# Patient Record
Sex: Female | Born: 1955 | Race: White | Hispanic: No | Marital: Married | Smoking: Former smoker
Health system: Southern US, Community
[De-identification: ages and names within clinical notes are randomized; demographics above are authoritative.]

## PROBLEM LIST (undated history)

## (undated) DIAGNOSIS — E669 Obesity, unspecified: Secondary | ICD-10-CM

## (undated) HISTORY — DX: Obesity, unspecified: E66.9

---

## 2005-05-09 ENCOUNTER — Encounter: Payer: Self-pay | Admitting: Family Medicine

## 2005-05-09 ENCOUNTER — Other Ambulatory Visit: Admission: RE | Admit: 2005-05-09 | Discharge: 2005-05-09 | Payer: Self-pay | Admitting: Family Medicine

## 2005-05-09 ENCOUNTER — Ambulatory Visit: Payer: Self-pay | Admitting: Family Medicine

## 2005-05-29 ENCOUNTER — Ambulatory Visit: Payer: Self-pay

## 2005-05-29 ENCOUNTER — Encounter: Payer: Self-pay | Admitting: Cardiology

## 2005-07-21 ENCOUNTER — Encounter: Admission: RE | Admit: 2005-07-21 | Discharge: 2005-07-21 | Payer: Self-pay | Admitting: Family Medicine

## 2005-08-08 ENCOUNTER — Ambulatory Visit: Payer: Self-pay | Admitting: Family Medicine

## 2005-09-19 ENCOUNTER — Ambulatory Visit: Payer: Self-pay | Admitting: Family Medicine

## 2005-11-03 ENCOUNTER — Ambulatory Visit: Payer: Self-pay | Admitting: Family Medicine

## 2005-12-27 ENCOUNTER — Ambulatory Visit: Payer: Self-pay | Admitting: Family Medicine

## 2008-08-26 ENCOUNTER — Ambulatory Visit: Payer: Self-pay | Admitting: Internal Medicine

## 2009-12-29 ENCOUNTER — Ambulatory Visit: Payer: Self-pay | Admitting: Family Medicine

## 2009-12-29 ENCOUNTER — Encounter: Payer: Self-pay | Admitting: Internal Medicine

## 2009-12-29 ENCOUNTER — Other Ambulatory Visit: Admission: RE | Admit: 2009-12-29 | Discharge: 2009-12-29 | Payer: Self-pay | Admitting: Family Medicine

## 2009-12-29 DIAGNOSIS — Z780 Asymptomatic menopausal state: Secondary | ICD-10-CM | POA: Insufficient documentation

## 2009-12-29 LAB — CONVERTED CEMR LAB
Bilirubin Urine: NEGATIVE
Blood in Urine, dipstick: NEGATIVE
Ketones, urine, test strip: NEGATIVE
Specific Gravity, Urine: 1.015
Urobilinogen, UA: NEGATIVE
pH: 6

## 2009-12-30 ENCOUNTER — Encounter (INDEPENDENT_AMBULATORY_CARE_PROVIDER_SITE_OTHER): Payer: Self-pay | Admitting: *Deleted

## 2009-12-30 LAB — CONVERTED CEMR LAB
ALT: 17 units/L (ref 0–35)
AST: 18 units/L (ref 0–37)
Basophils Relative: 0.7 % (ref 0.0–3.0)
CO2: 28 meq/L (ref 19–32)
Calcium: 9.6 mg/dL (ref 8.4–10.5)
Eosinophils Absolute: 0.4 10*3/uL (ref 0.0–0.7)
Eosinophils Relative: 5.6 % — ABNORMAL HIGH (ref 0.0–5.0)
GFR calc non Af Amer: 95 mL/min (ref >60)
HCT: 42.8 % (ref 36.0–46.0)
Lymphocytes Relative: 30.4 % (ref 12.0–46.0)
Lymphs Abs: 2.4 10*3/uL (ref 0.7–4.0)
MCHC: 34.3 g/dL (ref 30.0–36.0)
Monocytes Absolute: 0.5 10*3/uL (ref 0.1–1.0)
Monocytes Relative: 6 % (ref 3.0–12.0)
Potassium: 5 meq/L (ref 3.5–5.1)
Total Bilirubin: 0.4 mg/dL (ref 0.3–1.2)
Total Protein: 6.9 g/dL (ref 6.0–8.3)
WBC: 7.7 10*3/uL (ref 4.5–10.5)

## 2010-01-03 ENCOUNTER — Encounter (INDEPENDENT_AMBULATORY_CARE_PROVIDER_SITE_OTHER): Payer: Self-pay | Admitting: *Deleted

## 2010-01-03 LAB — CONVERTED CEMR LAB: Pap Smear: NEGATIVE

## 2010-01-14 ENCOUNTER — Ambulatory Visit: Payer: Self-pay | Admitting: Family Medicine

## 2010-01-14 DIAGNOSIS — J019 Acute sinusitis, unspecified: Secondary | ICD-10-CM

## 2010-01-20 ENCOUNTER — Encounter: Admission: RE | Admit: 2010-01-20 | Discharge: 2010-01-20 | Payer: Self-pay | Admitting: Family Medicine

## 2010-02-02 ENCOUNTER — Encounter (INDEPENDENT_AMBULATORY_CARE_PROVIDER_SITE_OTHER): Payer: Self-pay | Admitting: *Deleted

## 2010-02-07 ENCOUNTER — Ambulatory Visit: Payer: Self-pay | Admitting: Gastroenterology

## 2010-02-16 ENCOUNTER — Ambulatory Visit: Payer: Self-pay | Admitting: Gastroenterology

## 2010-06-07 NOTE — Letter (Signed)
Summary: Moviprep Instructions  [facility]  [address]   [place], [place] [address]   Phone: [phone]  Fax: [phone]       Judith [name]    1955-05-29    MRN: [id number]        Procedure Day /Date: Wednesday, 02-16-10     Arrival Time: 9:30 a.m.      Procedure Time: 10:30 a.m.     Location of Procedure:                    x   [facility] (4th Floor)                        PREPARATION FOR COLONOSCOPY WITH MOVIPREP   Starting 5 days prior to your procedure 02-11-10  do not eat nuts, seeds, popcorn, corn, beans, peas,  salads, or any raw vegetables.  Do not take any fiber supplements (e.g. Metamucil, Citrucel, and Benefiber).  THE DAY BEFORE YOUR PROCEDURE         DATE:  02-15-10  DAY: Tuesday  1.  Drink clear liquids the entire day-NO SOLID FOOD  2.  Do not drink anything colored red or purple.  Avoid juices with pulp.  No orange juice.  3.  Drink at least 64 oz. (8 glasses) of fluid/clear liquids during the day to prevent dehydration and help the prep work efficiently.  CLEAR LIQUIDS INCLUDE: Water Jello Ice Popsicles Tea (sugar ok, no milk/cream) Powdered fruit flavored drinks Coffee (sugar ok, no milk/cream) Gatorade Juice: apple, white grape, white cranberry  Lemonade Clear bullion, consomm, broth Carbonated beverages (any kind) Strained chicken noodle soup Hard Candy                             4.  In the morning, mix first dose of MoviPrep solution:    Empty 1 Pouch A and 1 Pouch B into the disposable container    Add lukewarm drinking water to the top line of the container. Mix to dissolve    Refrigerate (mixed solution should be used within 24 hrs)  5.  Begin drinking the prep at 5:00 p.m. The MoviPrep container is divided by 4 marks.   Every 15 minutes drink the solution down to the next mark (approximately 8 oz) until the full liter is complete.   6.  Follow completed prep with 16 oz of clear liquid  of your choice (Nothing red or purple).  Continue to drink clear liquids until bedtime.  7.  Before going to bed, mix second dose of MoviPrep solution:    Empty 1 Pouch A and 1 Pouch B into the disposable container    Add lukewarm drinking water to the top line of the container. Mix to dissolve    Refrigerate  THE DAY OF YOUR PROCEDURE      DATE: 02-16-10  DAY: Wednesday  Beginning at 5:30 a.m. (5 hours before procedure):         1. Every 15 minutes, drink the solution down to the next mark (approx 8 oz) until the full liter is complete.  2. Follow completed prep with 16 oz. of clear liquid of your choice.    3. You may drink clear liquids until 8:30 a.m. (2 HOURS BEFORE PROCEDURE).   MEDICATION INSTRUCTIONS  Unless otherwise instructed, you should take regular prescription medications with a small sip of water   as early as  possible the morning of your procedure.           OTHER INSTRUCTIONS  You will need a responsible adult at least 53 years of age to accompany you and drive you home.   This person must remain in the waiting room during your procedure.  Wear loose fitting clothing that is easily removed.  Leave jewelry and other valuables at home.  However, you may wish to bring a book to read or  an iPod/MP3 player to listen to music as you wait for your procedure to start.  Remove all body piercing jewelry and leave at home.  Total time from sign-in until discharge is approximately 2-3 hours.  You should go home directly after your procedure and rest.  You can resume normal activities the  day after your procedure.  The day of your procedure you should not:   Drive   Make legal decisions   Operate machinery   Drink alcohol   Return to work  You will receive specific instructions about eating, activities and medications before you leave.    The above instructions have been reviewed and explained to me by   Ezra Sites RN  February 07, 2010 8:35  AM     I fully understand and can verbalize these instructions _____________________________ Date _________

## 2010-06-07 NOTE — Letter (Signed)
Summary: Primary Care Consult Scheduled Letter  [facility]  [address] [place], [place] [address]   Phone: [phone]  Fax: [phone]      12/30/2009 MRN: [id number]  [name] [address] [place], [place]  [address]    Dear Ms. Donata Clay,    We have scheduled an appointment for you.  At the recommendation of Dr. Loreen Freud, we have scheduled you for a Screening Colonoscopy with [facility] on 01-13-2010 at 7:45am.  Their address is [address], [address], [place] [address]. The office phone number is [phone].  If this appointment day and time is not convenient for you, please feel free to call the office of the doctor you are being referred to at the number listed above and reschedule the appointment.    It is important for you to keep your scheduled appointments. We are here to make sure you are given good patient care.   Thank you,    Renee, Patient Care Coordinator [facility]

## 2010-06-07 NOTE — Procedures (Signed)
Summary: Colonoscopy  Patient: Judith [name] Note: All result statuses are Final unless otherwise noted.  Tests: (1) Colonoscopy (COL)   COL Colonoscopy           DONE     [facility]     [address]     [place], [place]  [address]           COLONOSCOPY PROCEDURE REPORT           PATIENT:  Judith, [name]  MR#:  [id number]     BIRTHDATE:  02-03-1956, 54 yrs. old  GENDER:  female           ENDOSCOPIST:  Barbette Hair. Arlyce Dice, MD     Referred by:  Loreen Freud, DO           PROCEDURE DATE:  02/16/2010     PROCEDURE:  Diagnostic Colonoscopy     ASA CLASS:  Class I     INDICATIONS:  1) Routine Risk Screening           MEDICATIONS:   Fentanyl 75 mcg IV, Versed 6 mg IV           DESCRIPTION OF PROCEDURE:   After the risks benefits and     alternatives of the procedure were thoroughly explained, informed     consent was obtained.  Digital rectal exam was performed and     revealed no abnormalities.   The LB CF-[id number] endoscope     was introduced through the anus and advanced to the cecum, which     was identified by both the appendix and ileocecal valve, without     limitations.  The quality of the prep was excellent, using     MiraLax.  The instrument was then slowly withdrawn as the colon     was fully examined.     <<PROCEDUREIMAGES>>           FINDINGS:  Mild diverticulosis was found in the sigmoid colon (see     image19).  This was otherwise a normal examination of the colon     (see image2, image3, image5, image6, image8, image9, image11,     image13, image20, and image21).   Retroflexed views in the rectum     revealed no abnormalities.    The time to cecum =  3.50  minutes.     The scope was then withdrawn (time =  6.75  min) from the patient     and the procedure completed.           COMPLICATIONS:  None           ENDOSCOPIC IMPRESSION:     1) Mild diverticulosis in the sigmoid colon     2) Otherwise normal examination  RECOMMENDATIONS:     1) Continue current colorectal screening recommendations for     "routine risk" patients with a repeat colonoscopy in 10 years.           REPEAT EXAM:  In 10 year(s) for Colonoscopy.           ______________________________     Barbette Hair. Arlyce Dice, MD           CC:           n.     eSIGNED:   Barbette Hair. Kaplan at 02/16/2010 11:33 AM           Page 2 of 3   [name], Judith, [name] [id number]  Note: An exclamation mark (!) indicates a  result that was not dispersed into the flowsheet. Document Creation Date: 02/16/2010 11:34 AM _______________________________________________________________________  (1) Order result status: Final Collection or observation date-time: 02/16/2010 11:29 Requested date-time:  Receipt date-time:  Reported date-time:  Referring Physician:   Ordering Physician: Melvia Heaps [phone]) Specimen Source:  Source: Launa Grill Order Number: [id number] Lab site:   Appended Document: Colonoscopy    Clinical Lists Changes  Observations: Added new observation of COLONNXTDUE: 02/2020 (02/16/2010 13:13)

## 2010-06-07 NOTE — Letter (Signed)
Summary: Results Follow up Letter  [facility]  [address] [place], [place] [address]   Phone: [phone]  Fax: [phone]    01/03/2010 MRN: [id number]  [name] [address] [place], [place]  [address]  Dear Ms. Judith [name],  The following are the results of your recent test(s):  Test         Result    Pap Smear:        Normal __X__  Not Normal _____ Comments: ______________________________________________________ Cholesterol: LDL(Bad cholesterol):         Your goal is less than:         HDL (Good cholesterol):       Your goal is more than: Comments:  ______________________________________________________ Mammogram:        Normal _____  Not Normal _____ Comments:  ___________________________________________________________________ Hemoccult:        Normal _____  Not normal _______ Comments:    _____________________________________________________________________ Other Tests:    We routinely do not discuss normal results over the telephone.  If you desire a copy of the results, or you have any questions about this information we can discuss them at your next office visit.   Sincerely,

## 2010-06-07 NOTE — Letter (Signed)
Summary: Previsit letter  [facility]  [address] [place], [place] [address]   Phone: [phone]  Fax: [phone]       12/30/2009 MRN: [id number]  [name] [address] [place], [place]  [address]  Dear Judith [name],  Welcome to the Gastroenterology Division at [facility].    You are scheduled to see a nurse for your pre-procedure visit on 02/07/2010 at 8:30AM on the 3rd floor at [facility], [address]  We ask that you try to arrive at our office 15 minutes prior to your appointment time to allow for check-in.  Your nurse visit will consist of discussing your medical and surgical history, your immediate family medical history, and your medications.    Please bring a complete list of all your medications or, if you prefer, bring the medication bottles and we will list them.  We will need to be aware of both prescribed and over the counter drugs.  We will need to know exact dosage information as well.  If you are on blood thinners (Coumadin, Plavix, Aggrenox, Ticlid, etc.) please call our office today/prior to your appointment, as we need to consult with your physician about holding your medication.   Please be prepared to read and sign documents such as consent forms, a financial agreement, and acknowledgement forms.  If necessary, and with your consent, a friend or relative is welcome to sit-in on the nurse visit with you.  Please bring your insurance card so that we may make a copy of it.  If your insurance requires a referral to see a specialist, please bring your referral form from your primary care physician.  No co-pay is required for this nurse visit.     If you cannot keep your appointment, please call [phone] to cancel or reschedule prior to your appointment date.  This allows [place] the opportunity to schedule an appointment for another patient in need of care.    Thank you for choosing [facility] for your medical needs.  We appreciate the opportunity to care for you.  Please visit [place] at our website  to learn more about our practice.                     Sincerely.                                                                                                                   The Gastroenterology Division

## 2010-06-07 NOTE — Miscellaneous (Signed)
Summary: [facility]  Clinical Lists Changes  Medications: Added new medication of MOVIPREP 100 GM  SOLR (PEG-KCL-NACL-NASULF-NA ASC-C) As per prep instructions. - Signed Rx of MOVIPREP 100 GM  SOLR (PEG-KCL-NACL-NASULF-NA ASC-C) As per prep instructions.;  #1 x 0;  Signed;  Entered by: Ezra Sites RN;  Authorized by: Louis Meckel MD;  Method used: [facility] [phone]*, [address], [place], [place], [place]  [address], Ph: [phone], Fax: [phone] Observations: Added new observation of NKA: T (02/07/2010 8:20)    Prescriptions: MOVIPREP 100 GM  SOLR (PEG-KCL-NACL-NASULF-NA ASC-C) As per prep instructions.  #1 x 0   Entered by:   Ezra Sites RN   Authorized by:   Louis Meckel MD   Signed by:   Ezra Sites RN on 02/07/2010   Method used:   [facility]ronically to        [facility] [phone]* (retail)       [address]       [place], [place]  [address]       Ph: [phone]       Fax: [phone]   RxID:   [id number]

## 2010-06-07 NOTE — Assessment & Plan Note (Signed)
Summary: cold//lch   Vital Signs:  Patient profile:   55 year old female Weight:      230.4 pounds O2 Sat:      98 % on Room air Temp:     98.2 degrees F Pulse rate:   96 / minute Pulse rhythm:   regular BP sitting:   120 / 74  (left arm)  Vitals Entered By: Almeta Monas CMA Duncan Dull) (January 14, 2010 11:35 AM)  O2 Flow:  Room air CC: c/o congestion and cough that has gotten worst X3days, URI symptoms   CC:  c/o congestion and cough that has gotten worst X3days and URI symptoms.  History of Present Illness:       This is a 55 year old woman who presents with URI symptoms.  The symptoms began 53 days ago.  Pt is taking mucinex DM with little relief.   .  The patient complains of nasal congestion, purulent nasal discharge, productive cough, and sick contacts.  Associated symptoms include fever of 100.5-103 degrees.  The patient denies fever, low-grade fever (<100.5 degrees), fever of 103.1-104 degrees, fever to >104 degrees, stiff neck, dyspnea, wheezing, rash, vomiting, diarrhea, use of an antipyretic, and response to antipyretic.  The patient also reports sneezing and headache.  The patient denies the following risk factors for Strep sinusitis: unilateral facial pain, unilateral nasal discharge, poor response to decongestant, double sickening, tooth pain, Strep exposure, tender adenopathy, and absence of cough.    Current Medications (verified): 1)  Vit C 2)  Calcium 600 600 Mg Tabs (Calcium Carbonate) .Marland Kitchen.. 1 By Mouth Qd 3)  Vitamin D 1000 Unit Tabs (Cholecalciferol) .Marland Kitchen.. 1 By Mouth Qd 4)  Augmentin 875-125 Mg Tabs (Amoxicillin-Pot Clavulanate) .Marland Kitchen.. 1 By Mouth Two Times A Day 5)  Veramyst 27.5 Mcg/spray Susp (Fluticasone Furoate) .... 2 Sprays Each Nostril Once Daily 6)  Claritin 10 Mg Tabs (Loratadine) .Marland Kitchen.. 1 By Mouth Once Daily  Allergies (verified): No Known Drug Allergies  Past History:  Past medical, surgical, family and social histories (including risk factors) reviewed  for relevance to current acute and chronic problems.  Past Medical History: Reviewed history from 12/29/2009 and no changes required. Current Problems:  POSTMENOPAUSAL STATUS (ICD-V49.81) PREVENTIVE HEALTH CARE (ICD-V70.0)  Past Surgical History: Reviewed history from 12/29/2009 and no changes required. Denies surgical history  Family History: Reviewed history from 12/29/2009 and no changes required. MGF--stroke 60s MGM--CAD 1995---53yo PGF--copd--smoker PGM==died in late 61s  Mother --lymphoma-- 75yo Father--septicemia,  Renal CA no brothers or sisters  Social History: Reviewed history from 12/29/2009 and no changes required. 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  Otoscopic examination reveals clear canals, tympanic membranes are intact bilaterally without bulging, retraction, inflammation or discharge. Hearing is grossly normal bilaterally. Nose:  L frontal sinus tenderness, L maxillary sinus tenderness, R frontal sinus tenderness, and R maxillary sinus tenderness.   Mouth:  Oral mucosa and oropharynx without lesions or exudates.  Teeth in good repair. Neck:  No deformities, masses, or tenderness noted. Lungs:  Normal respiratory effort, chest expands symmetrically. Lungs are clear to auscultation, no crackles or wheezes. Heart:  Normal rate and regular rhythm. S1 and S2 normal without gallop, murmur, click, rub or other extra sounds. Extremities:  No  clubbing, cyanosis, edema, or deformity noted with normal full range of motion of all joints.   Psych:  Cognition and judgment appear intact. Alert and cooperative with normal attention span and concentration. No  apparent delusions, illusions, hallucinations   Impression & Recommendations:  Problem # 1:  SINUSITIS - ACUTE-NOS (ICD-461.9)  Her updated medication list for this problem includes:    Augmentin 875-125 Mg Tabs (Amoxicillin-pot clavulanate) .Marland Kitchen... 1 by mouth two times a day    Veramyst 27.5 Mcg/spray Susp (Fluticasone furoate) .Marland Kitchen... 2 sprays each nostril once daily  Instructed on treatment. Call if symptoms persist or worsen.   Complete Medication List: 1)  Vit C  2)  Calcium 600 600 Mg Tabs (Calcium carbonate) .Marland Kitchen.. 1 by mouth qd 3)  Vitamin D 1000 Unit Tabs (Cholecalciferol) .Marland Kitchen.. 1 by mouth qd 4)  Augmentin 875-125 Mg Tabs (Amoxicillin-pot clavulanate) .Marland Kitchen.. 1 by mouth two times a day 5)  Veramyst 27.5 Mcg/spray Susp (Fluticasone furoate) .... 2 sprays each nostril once daily 6)  Claritin 10 Mg Tabs (Loratadine) .Marland Kitchen.. 1 by mouth once daily Prescriptions: AUGMENTIN 875-125 MG TABS (AMOXICILLIN-POT CLAVULANATE) 1 by mouth two times a day  #20 x 0   Entered and Authorized by:   Loreen Freud DO   Signed by:   Loreen Freud DO on 01/14/2010   Method used:   [facility]        [facility] [phone]* (retail)       [address]       [place], [place]  [address]       Ph: [phone]       Fax: [phone]   RxID:   [id number]

## 2010-06-07 NOTE — Assessment & Plan Note (Signed)
Summary: cpx- lab/cbs   Vital Signs:  Patient profile:   55 year old female Height:      65 inches Weight:      232 pounds BMI:     38.75 Pulse rate:   84 / minute BP sitting:   124 / 84  (right arm)  Vitals Entered By: Almeta Monas CMA Duncan Dull) (December 29, 2009 9:24 AM) CC: cpx, pt is fasting, mammogram and colonoscopy due, pap today wants to discuss menopause   History of Present Illness: Pt here for cpe, pap and labs.  Pt c/o hot flashes since stopping periods in January.  Pt states they are not terrible.  They are bearable.  Preventive Screening-Counseling & Management  Alcohol-Tobacco     Alcohol drinks/day: 3     Alcohol type: wine     Smoking Status: quit     Packs/Day: <0.25     Year Started: 1983     Year Quit: 1985  Caffeine-Diet-Exercise     Caffeine use/day: 0     Does Patient Exercise: no     Exercise Counseling: to improve exercise regimen  Hep-HIV-STD-Contraception     Dental Visit-last 6 months no     Dental Care Counseling: to seek dental care; no dental care within six months     SBE monthly: yes     SBE Education/Counseling: not indicated; SBE done regularly      Sexual History:  currently monogamous and married.        Drug Use:  no.    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 MGF--stroke 60s MGM--CAD 1995---53yo PGF--copd--smoker PGM==died in late 64s  Mother --lymphoma-- 75yo Father--septicemia,  Renal CA no brothers or sisters  Social History: Reviewed history and no changes required. Occupation:  Administrator, arts Married Former Smoker Alcohol use-yes Drug use-no Regular exercise-no Does Patient Exercise:  no Caffeine use/day:  0 Smoking Status:  quit Packs/Day:  <0.25 Dental Care w/in 6 mos.:  no Sexual History:  currently monogamous, married Occupation:  employed Drug Use:  no  Review of Systems      See HPI General:  Denies chills, fatigue, fever, loss of appetite, malaise, sleep disorder, sweats, weakness, and weight loss. Eyes:  Denies blurring, discharge, double vision, eye irritation, eye pain, halos, itching, light sensitivity, red eye, vision loss-1 eye, and vision loss-both eyes; optho--q2y. ENT:  Denies decreased hearing, difficulty swallowing, ear discharge, earache, hoarseness, nasal congestion, nosebleeds, postnasal drainage, ringing in ears, sinus pressure, and sore throat. CV:  Denies bluish discoloration of lips or nails, chest pain or discomfort, difficulty breathing at night, difficulty breathing while lying down, fainting, fatigue, leg cramps with exertion, lightheadness, near fainting, palpitations, shortness of breath with exertion, swelling of feet, swelling of hands, and weight gain. Resp:  Denies chest discomfort, chest pain with inspiration, cough, coughing up blood, excessive  snoring, hypersomnolence, morning headaches, pleuritic, shortness of breath, sputum productive, and wheezing. GI:  Denies abdominal pain, bloody stools, change in bowel habits, constipation, dark tarry stools, diarrhea, excessive appetite, gas, hemorrhoids, indigestion, loss of  appetite, and nausea. GU:  Denies abnormal vaginal bleeding, decreased libido, discharge, dysuria, genital sores, hematuria, incontinence, nocturia, urinary frequency, and urinary hesitancy. MS:  Denies joint pain, joint redness, joint swelling, loss of strength, low back pain, mid back pain, muscle aches, muscle , cramps, muscle weakness, stiffness, and thoracic pain. Derm:  Denies changes in color of skin, changes in nail beds, dryness, excessive perspiration, flushing, hair loss, insect bite(s), itching, lesion(s), poor wound healing, and rash. Neuro:  Denies brief paralysis, difficulty with concentration, disturbances in coordination, falling down, headaches, inability to speak, memory loss, numbness, poor balance, seizures, sensation of room spinning, tingling, tremors, visual disturbances, and weakness. Psych:  Denies alternate hallucination ( auditory/visual), anxiety, depression, easily angered, easily tearful, irritability, mental problems, panic attacks, sense of great danger, suicidal thoughts/plans, thoughts of violence, unusual visions or sounds, and thoughts /plans of harming others. Endo:  Denies cold intolerance, excessive hunger, excessive thirst, excessive urination, heat intolerance, polyuria, and weight change. Heme:  Denies abnormal bruising, bleeding, enlarge lymph nodes, fevers, pallor, and skin discoloration. Allergy:  Denies hives or rash, itching eyes, persistent infections, seasonal allergies, and sneezing.  Physical Exam  General:  Well-developed,well-nourished,in no acute distress; alert,appropriate and cooperative throughout examination Head:  Normocephalic and atraumatic without obvious abnormalities. No apparent alopecia or balding. Eyes:  vision grossly intact, pupils equal, pupils round, pupils reactive to light, and no injection.   Ears:  External ear exam shows no significant lesions or deformities.  Otoscopic examination reveals clear canals, tympanic membranes are  intact bilaterally without bulging, retraction, inflammation or discharge. Hearing is grossly normal bilaterally. Nose:  External nasal examination shows no deformity or inflammation. Nasal mucosa are pink and moist without lesions or exudates. Mouth:  Oral mucosa and oropharynx without lesions or exudates.  Teeth in good repair. Neck:  No deformities, masses, or tenderness noted. Chest Wall:  No deformities, masses, or tenderness noted. Breasts:  No mass, nodules, thickening, tenderness, bulging, retraction, inflamation, nipple discharge or skin changes noted.   Lungs:  Normal respiratory effort, chest expands symmetrically. Lungs are clear to auscultation, no crackles or wheezes. Heart:  normal rate and no murmur.   Abdomen:  Bowel sounds positive,abdomen soft and non-tender without masses, organomegaly or hernias noted. Rectal:  No external abnormalities noted. Normal sphincter tone. No rectal masses or tenderness. heme negative brown stool Genitalia:  Pelvic Exam:        External: normal female genitalia without lesions or masses        Vagina: normal without lesions or masses        Cervix: normal without lesions or masses        Adnexa: normal bimanual exam without masses or fullness        Uterus: normal by palpation        Pap smear: performed Msk:  normal ROM, no joint tenderness, no joint swelling, no joint warmth, no redness over joints, no joint deformities, no joint instability, and no crepitation.   Pulses:  R posterior tibial normal, R dorsalis pedis normal, R carotid normal, L posterior tibial normal, L dorsalis pedis normal, and L carotid normal.   Extremities:  No clubbing, cyanosis, edema, or deformity noted with normal full range of motion of all joints.   Neurologic:  No cranial nerve deficits  noted. Station and gait are normal. Plantar reflexes are down-going bilaterally. DTRs are symmetrical throughout. Sensory, motor and coordinative functions appear intact. Skin:  Intact  without suspicious lesions or rashes Cervical Nodes:  No lymphadenopathy noted Axillary Nodes:  No palpable lymphadenopathy Psych:  Cognition and judgment appear intact. Alert and cooperative with normal attention span and concentration. No apparent delusions, illusions, hallucinations   Impression & Recommendations:  Problem # 1:  PREVENTIVE HEALTH CARE (ICD-V70.0) ghm utd  Orders: Venipuncture (95621) TLB-Lipid Panel (80061-LIPID) TLB-BMP (Basic Metabolic Panel-BMET) (80048-METABOL) TLB-CBC Platelet - w/Differential (85025-CBCD) TLB-Hepatic/Liver Function Pnl (80076-HEPATIC) TLB-TSH (Thyroid Stimulating Hormone) (84443-TSH) TLB-FSH (Follicle Stimulating Hormone) (83001-FSH) TLB-Luteinizing Hormone (LH) (83002-LH) T- * Misc. Laboratory test [phone]) EKG w/ Interpretation (93000) Specimen Handling (78469) UA Dipstick w/o Micro (manual) (81002) Gastroenterology Referral (GI) Radiology Referral (Radiology)  Problem # 2:  POSTMENOPAUSAL STATUS (ICD-V49.81)  Orders: Venipuncture (62952) TLB-Lipid Panel (80061-LIPID) TLB-BMP (Basic Metabolic Panel-BMET) (80048-METABOL) TLB-CBC Platelet - w/Differential (85025-CBCD) TLB-Hepatic/Liver Function Pnl (80076-HEPATIC) TLB-TSH (Thyroid Stimulating Hormone) (84443-TSH) TLB-FSH (Follicle Stimulating Hormone) (83001-FSH) TLB-Luteinizing Hormone (LH) (83002-LH) T- * Misc. Laboratory test [phone]) EKG w/ Interpretation (93000) Specimen Handling (44010) UA Dipstick w/o Micro (manual) (81002)  Complete Medication List: 1)  Vit C   Patient Instructions: 1)  It is important that you exercise reguarly at least 20 minutes 5 times a week. If you develop chest pain, have severe difficulty breathing, or feel very tired, stop exercising immediately and seek medical attention.  2)  Take calcium +vitamin D daily. --- 1200-1500 mg divided daily with 1000u vita D3    EKG  Procedure date:  12/29/2009  Findings:      sinus rhythm 84  bpm  EKG  Procedure date:  12/29/2009  Findings:      Left axis deviation.     Flu Vaccine Next Due:  Refused TD Result Date:  12/21/2004 TD Result:  given TD Next Due:  10 yr  Laboratory Results   Urine Tests   Date/Time Reported: December 29, 2009 10:56 AM   Routine Urinalysis   Color: yellow Appearance: Clear Glucose: negative   (Normal Range: Negative) Bilirubin: negative   (Normal Range: Negative) Ketone: negative   (Normal Range: Negative) Spec. Gravity: 1.015   (Normal Range: 1.003-1.035) Blood: negative   (Normal Range: Negative) pH: 6.0   (Normal Range: 5.0-8.0) Protein: negative   (Normal Range: Negative) Urobilinogen: negative   (Normal Range: 0-1) Nitrite: negative   (Normal Range: Negative) Leukocyte Esterace: negative   (Normal Range: Negative)    Comments: Floydene Flock  December 29, 2009 10:57 AM

## 2011-09-09 ENCOUNTER — Ambulatory Visit (INDEPENDENT_AMBULATORY_CARE_PROVIDER_SITE_OTHER): Payer: PRIVATE HEALTH INSURANCE | Admitting: Family Medicine

## 2011-09-09 VITALS — BP 134/100 | HR 116 | Temp 97.8°F | Resp 16 | Ht 65.0 in | Wt 230.0 lb

## 2011-09-09 DIAGNOSIS — Z23 Encounter for immunization: Secondary | ICD-10-CM

## 2011-09-09 DIAGNOSIS — M25569 Pain in unspecified knee: Secondary | ICD-10-CM

## 2011-09-09 DIAGNOSIS — S81019A Laceration without foreign body, unspecified knee, initial encounter: Secondary | ICD-10-CM

## 2011-09-09 DIAGNOSIS — S81009A Unspecified open wound, unspecified knee, initial encounter: Secondary | ICD-10-CM

## 2011-09-09 NOTE — Progress Notes (Signed)
  Subjective:    Patient ID: Judith [name], female    DOB: 24-May-1955, 56 y.o.   MRN: [id number]  HPI Tripped on stairs at church about 4:15 this afternoon.  Hit Left knee on the stairs.  Went home and saw laceration and here for eval.  Unknown last tetanus.  FROM and strength of knee. Able to walk but hurts.    Review of Systems Negative except as per HPI     Objective:   Physical Exam  Constitutional: She appears well-developed.  Pulmonary/Chest: Effort normal.  Neurological: She is alert.  Skin:          approx 10 cm linear lac, with subcu fat visible, over patellar tendon of left knee.  Full strength and rom.  Tendon intact.           Assessment & Plan:  Knee lac - repaired per Porfirio Oar, PA-C note.  Tetanus shot updated.

## 2011-09-09 NOTE — Patient Instructions (Signed)
Try to avoid deep knee bending until the stitches are removed.  WOUND CARE Please return in 7-10 days to have your stitches/staples removed or sooner if you have concerns. Marland Kitchen Keep area clean and dry for 24 hours. Do not remove bandage, if applied. . After 24 hours, remove bandage and wash wound gently with mild soap and warm water. Reapply a new bandage after cleaning wound, if directed. . Continue daily cleansing with soap and water until stitches/staples are removed. . Do not apply any ointments or creams to the wound while stitches/staples are in place, as this may cause delayed healing. . Notify the office if you experience any of the following signs of infection: Swelling, redness, pus drainage, streaking, fever >101.0 F . Notify the office if you experience excessive bleeding that does not stop after 15-20 minutes of constant, firm pressure.

## 2011-09-09 NOTE — Progress Notes (Signed)
Verbal consent obtained from the patient.  Local anesthesia with 10cc Lidocaine 1% with epinephrine.  Wound scrubbed with soap and water and rinsed.  Wound closed with 13 4-0 Ethilon (#8 HM, #5 SI) sutures.  Wound cleansed and dressed.

## 2011-09-19 ENCOUNTER — Ambulatory Visit (INDEPENDENT_AMBULATORY_CARE_PROVIDER_SITE_OTHER): Payer: PRIVATE HEALTH INSURANCE | Admitting: Family Medicine

## 2011-09-19 VITALS — BP 118/89 | HR 88 | Temp 98.2°F | Resp 18 | Ht 65.0 in | Wt 232.4 lb

## 2011-09-19 DIAGNOSIS — S81019A Laceration without foreign body, unspecified knee, initial encounter: Secondary | ICD-10-CM

## 2011-09-19 DIAGNOSIS — T148XXA Other injury of unspecified body region, initial encounter: Secondary | ICD-10-CM

## 2011-09-19 DIAGNOSIS — L089 Local infection of the skin and subcutaneous tissue, unspecified: Secondary | ICD-10-CM

## 2011-09-19 DIAGNOSIS — S81809A Unspecified open wound, unspecified lower leg, initial encounter: Secondary | ICD-10-CM

## 2011-09-19 MED ORDER — DOXYCYCLINE HYCLATE 100 MG PO TABS: 100.0000 mg | ORAL_TABLET | Freq: Two times a day (BID) | ORAL | Status: AC

## 2011-09-19 NOTE — Progress Notes (Signed)
  Subjective:    Patient ID: Coralyn Pear, female    DOB: [birth year], 56 y.o.   MRN: [id number]  HPI 56 yo female seen 10 days ago for large knee laceration.  Repaired with 13 sutures.  Here for removal.  Medial side more painful than lateral - red.    Review of Systems Negative except as per HPI     Objective:   Physical Exam  Constitutional: She appears well-developed.  Pulmonary/Chest: Effort normal.  Neurological: She is alert.  Skin:       10 cm laceration to left knee, with good closure.  #13 sutures removed without difficulty.  Medial half with erythema, swelling, and tenderness primarily inferiorly.           Assessment & Plan:  Knee laceration - sutures removed Wound infection - doxy.  Recheck in 72 hours.  Sooner if worsens.

## 2011-09-22 ENCOUNTER — Ambulatory Visit (INDEPENDENT_AMBULATORY_CARE_PROVIDER_SITE_OTHER): Payer: PRIVATE HEALTH INSURANCE | Admitting: Physician Assistant

## 2011-09-22 VITALS — BP 131/87 | HR 91 | Temp 98.3°F | Resp 18 | Ht 65.0 in | Wt 232.0 lb

## 2011-09-22 DIAGNOSIS — L03119 Cellulitis of unspecified part of limb: Secondary | ICD-10-CM

## 2011-09-22 DIAGNOSIS — L089 Local infection of the skin and subcutaneous tissue, unspecified: Secondary | ICD-10-CM

## 2011-09-22 MED ORDER — SULFAMETHOXAZOLE-TRIMETHOPRIM 800-160 MG PO TABS: 1.0000 | ORAL_TABLET | Freq: Two times a day (BID) | ORAL | Status: AC

## 2011-09-22 NOTE — Progress Notes (Signed)
  Subjective:    Patient ID: Judith [name], female    DOB: [birth year], 56 y.o.   MRN: [id number]  HPI Patient presents for recheck of cellulitis of the right knee. Initial injury was 09/09/11 and then had sutures removed 09/19/11 and was noted to have surrounding erythema. Patient was placed on doxycycline 100 mg bid which she has been taking but she does not think it is helping. States the wound is still tender to touch and she has not noticed much change in the erythema. Denies any purulent drainage, fever, or chills.     Review of Systems  Constitutional: Negative for fever and chills.  Skin: Positive for wound (of right knee with surrounding erythema on the medial side ).       Objective:   Physical Exam  Constitutional: She is oriented to person, place, and time. She appears well-developed.  HENT:  Head: Normocephalic and atraumatic.  Right Ear: External ear normal.  Left Ear: External ear normal.  Eyes: Conjunctivae are normal.  Neck: Normal range of motion.  Musculoskeletal: Normal range of motion.  Neurological: She is alert and oriented to person, place, and time.  Skin: Erythema: surrounding wound on right knee. no purulence or warmth noted. no induration.          Assessment & Plan:   1. Cellulitis of knee  Will add Bactrim DS bid x 10 days  Recheck 09/24/11 or 09/25/11 for follow up evaluation Recommend rest and elevation over the next 2 days sulfamethoxazole-trimethoprim (BACTRIM DS,SEPTRA DS) 800-160 MG per tablet  2. Wound infection

## 2011-09-23 NOTE — Progress Notes (Signed)
Precepted with Ms. Marte, PA-C, and examined the patient, and agree.

## 2011-09-24 ENCOUNTER — Ambulatory Visit (INDEPENDENT_AMBULATORY_CARE_PROVIDER_SITE_OTHER): Payer: PRIVATE HEALTH INSURANCE | Admitting: Physician Assistant

## 2011-09-24 VITALS — BP 115/79 | HR 88 | Temp 99.2°F | Resp 16 | Ht 64.5 in | Wt 232.0 lb

## 2011-09-24 DIAGNOSIS — L02419 Cutaneous abscess of limb, unspecified: Secondary | ICD-10-CM

## 2011-09-24 DIAGNOSIS — L089 Local infection of the skin and subcutaneous tissue, unspecified: Secondary | ICD-10-CM

## 2011-09-24 DIAGNOSIS — L03119 Cellulitis of unspecified part of limb: Secondary | ICD-10-CM

## 2011-09-24 NOTE — Progress Notes (Signed)
Subjective: Patient presents for recheck of the wound on the right anterior knee. At previous visit on 09/22/11 there was erythema and tenderness around medial aspect of the wound. States there is somewhat decreased sensation around the wound but that the tenderness has improved.  Objective Skin: Today the erythema is markedly decreased and she has less tenderness to palpation of the area.   Assessment/Plan 1. Cellulitis, leg     Continue Bactrim DS and Doxycycline until completed     Follow up at completion of antibiotics, sooner if symptoms worsen

## 2011-10-02 NOTE — Progress Notes (Signed)
Seen and Precepted with Ms. Marte, PA-C and agree.  

## 2012-06-04 ENCOUNTER — Ambulatory Visit (INDEPENDENT_AMBULATORY_CARE_PROVIDER_SITE_OTHER): Payer: BC Managed Care – PPO | Admitting: Family Medicine

## 2012-06-04 ENCOUNTER — Encounter: Payer: Self-pay | Admitting: Family Medicine

## 2012-06-04 VITALS — BP 116/74 | HR 78 | Temp 98.5°F | Wt 235.4 lb

## 2012-06-04 DIAGNOSIS — J40 Bronchitis, not specified as acute or chronic: Secondary | ICD-10-CM

## 2012-06-04 MED ORDER — AZITHROMYCIN 250 MG PO TABS: ORAL_TABLET | ORAL | Status: DC

## 2012-06-04 NOTE — Progress Notes (Signed)
  Subjective:     Judith [name] is a 57 y.o. female who presents for evaluation of symptoms of a URI. Symptoms include congestion, fever laryngitis, non productive cough, shortness of breath and laryngitis. Onset of symptoms was 7 days ago, and has been gradually worsening since that time. Treatment to date: cough suppressants.  The following portions of the patient's history were reviewed and updated as appropriate: allergies, current medications, past family history, past medical history, past social history, past surgical history and problem list.  Review of Systems Pertinent items are noted in HPI.   Objective:    BP 116/74  Pulse 78  Temp 98.5 F (36.9 C) (Oral)  Wt 235 lb 6.4 oz (106.777 kg)  SpO2 97% General appearance: alert, cooperative, appears stated age and no distress Ears: normal TM's and external ear canals both ears Nose: Nares normal. Septum midline. Mucosa normal. No drainage or sinus tenderness. Throat: lips, mucosa, and tongue normal; teeth and gums normal Neck: mild anterior cervical adenopathy, supple, symmetrical, trachea midline and thyroid not enlarged, symmetric, no tenderness/mass/nodules Lungs: diminished breath sounds bilaterally Heart: S1, S2 normal   Assessment:    bronchitis  Plan:    Suggested symptomatic OTC remedies. Nasal saline spray for congestion. Zithromax per orders. Follow up as needed.  Subjective:

## 2012-06-04 NOTE — Patient Instructions (Addendum)

## 2013-02-26 ENCOUNTER — Telehealth: Payer: Self-pay

## 2013-02-26 NOTE — Telephone Encounter (Addendum)
Medication and allergies: updated and reviewed  90 day supply/mail order: na Local pharmacy: [facility]   Immunizations due:  Admin flu vaccine upon arrival  A/P:   No FH, SH changes PAP today Due for MMG Left knee injury requiring 13 sutures recently  To Discuss with Provider: Nothing at this time

## 2013-03-03 ENCOUNTER — Other Ambulatory Visit (HOSPITAL_COMMUNITY)
Admission: RE | Admit: 2013-03-03 | Discharge: 2013-03-03 | Disposition: A | Discharge status: Home / Self Care | Payer: No Typology Code available for payment source | Source: Ambulatory Visit | Attending: Family Medicine | Admitting: Family Medicine

## 2013-03-03 ENCOUNTER — Ambulatory Visit (INDEPENDENT_AMBULATORY_CARE_PROVIDER_SITE_OTHER): Payer: No Typology Code available for payment source | Admitting: Family Medicine

## 2013-03-03 ENCOUNTER — Encounter: Payer: Self-pay | Admitting: Family Medicine

## 2013-03-03 VITALS — BP 120/76 | HR 99 | Temp 98.8°F | Ht 64.5 in | Wt 236.8 lb

## 2013-03-03 DIAGNOSIS — Z0000 Encounter for general adult medical examination without abnormal findings: Secondary | ICD-10-CM

## 2013-03-03 DIAGNOSIS — Z1231 Encounter for screening mammogram for malignant neoplasm of breast: Secondary | ICD-10-CM

## 2013-03-03 DIAGNOSIS — Z01419 Encounter for gynecological examination (general) (routine) without abnormal findings: Secondary | ICD-10-CM | POA: Insufficient documentation

## 2013-03-03 DIAGNOSIS — R319 Hematuria, unspecified: Secondary | ICD-10-CM

## 2013-03-03 DIAGNOSIS — Z124 Encounter for screening for malignant neoplasm of cervix: Secondary | ICD-10-CM

## 2013-03-03 DIAGNOSIS — Z1151 Encounter for screening for human papillomavirus (HPV): Secondary | ICD-10-CM | POA: Insufficient documentation

## 2013-03-03 LAB — CBC WITH DIFFERENTIAL/PLATELET
Basophils Absolute: 0 10*3/uL (ref 0.0–0.1)
Basophils Relative: 0.4 % (ref 0.0–3.0)
Lymphocytes Relative: 18.9 % (ref 12.0–46.0)
MCV: 91.1 fl (ref 78.0–100.0)
Monocytes Absolute: 0.7 10*3/uL (ref 0.1–1.0)
Monocytes Relative: 5.5 % (ref 3.0–12.0)
Neutro Abs: 9.3 10*3/uL — ABNORMAL HIGH (ref 1.4–7.7)
Platelets: 373 10*3/uL (ref 150.0–400.0)
RBC: 4.79 Mil/uL (ref 3.87–5.11)
RDW: 13.4 % (ref 11.5–14.6)
WBC: 13 10*3/uL — ABNORMAL HIGH (ref 4.5–10.5)

## 2013-03-03 LAB — LIPID PANEL
LDL Cholesterol: 115 mg/dL — ABNORMAL HIGH (ref 0–99)
Total CHOL/HDL Ratio: 3
Triglycerides: 96 mg/dL (ref 0.0–149.0)
VLDL: 19.2 mg/dL (ref 0.0–40.0)

## 2013-03-03 LAB — BASIC METABOLIC PANEL
BUN: 15 mg/dL (ref 6–23)
CO2: 28 mEq/L (ref 19–32)
Calcium: 9.4 mg/dL (ref 8.4–10.5)
Chloride: 103 mEq/L (ref 96–112)
Glucose, Bld: 101 mg/dL — ABNORMAL HIGH (ref 70–99)
Sodium: 141 mEq/L (ref 135–145)

## 2013-03-03 LAB — POCT URINALYSIS DIPSTICK
Bilirubin, UA: NEGATIVE
Glucose, UA: NEGATIVE
Ketones, UA: NEGATIVE
Spec Grav, UA: >=1.03

## 2013-03-03 NOTE — Addendum Note (Signed)
Addended by: Verdie Shire on: 03/03/2013 04:59 PM   Modules accepted: Orders

## 2013-03-03 NOTE — Patient Instructions (Addendum)
rto 1 year for cpe    Preventive Care for Adults, Female A healthy lifestyle and preventive care can promote health and wellness. Preventive health guidelines for women include the following key practices.  A routine yearly physical is a good way to check with your caregiver about your health and preventive screening. It is a chance to share any concerns and updates on your health, and to receive a thorough exam.  Visit your dentist for a routine exam and preventive care every 6 months. Brush your teeth twice a day and floss once a day. Good oral hygiene prevents tooth decay and gum disease.  The frequency of eye exams is based on your age, health, family medical history, use of contact lenses, and other factors. Follow your caregiver's recommendations for frequency of eye exams.  Eat a healthy diet. Foods like vegetables, fruits, whole grains, low-fat dairy products, and lean protein foods contain the nutrients you need without too many calories. Decrease your intake of foods high in solid fats, added sugars, and salt. Eat the right amount of calories for you.Get information about a proper diet from your caregiver, if necessary.  Regular physical exercise is one of the most important things you can do for your health. Most adults should get at least 150 minutes of moderate-intensity exercise (any activity that increases your heart rate and causes you to sweat) each week. In addition, most adults need muscle-strengthening exercises on 2 or more days a week.  Maintain a healthy weight. The body mass index (BMI) is a screening tool to identify possible weight problems. It provides an estimate of body fat based on height and weight. Your caregiver can help determine your BMI, and can help you achieve or maintain a healthy weight.For adults 20 years and older:  A BMI below 18.5 is considered underweight.  A BMI of 18.5 to 24.9 is normal.  A BMI of 25 to 29.9 is considered overweight.  A BMI  of 30 and above is considered obese.  Maintain normal blood lipids and cholesterol levels by exercising and minimizing your intake of saturated fat. Eat a balanced diet with plenty of fruit and vegetables. Blood tests for lipids and cholesterol should begin at age 25 and be repeated every 5 years. If your lipid or cholesterol levels are high, you are over 50, or you are at high risk for heart disease, you may need your cholesterol levels checked more frequently.Ongoing high lipid and cholesterol levels should be treated with medicines if diet and exercise are not effective.  If you smoke, find out from your caregiver how to quit. If you do not use tobacco, do not start.  If you are pregnant, do not drink alcohol. If you are breastfeeding, be very cautious about drinking alcohol. If you are not pregnant and choose to drink alcohol, do not exceed 1 drink per day. One drink is considered to be 12 ounces (355 mL) of beer, 5 ounces (148 mL) of wine, or 1.5 ounces (44 mL) of liquor.  Avoid use of street drugs. Do not share needles with anyone. Ask for help if you need support or instructions about stopping the use of drugs.  High blood pressure causes heart disease and increases the risk of stroke. Your blood pressure should be checked at least every 1 to 2 years. Ongoing high blood pressure should be treated with medicines if weight loss and exercise are not effective.  If you are 77 to 55 years old, ask your caregiver if you  should take aspirin to prevent strokes.  Diabetes screening involves taking a blood sample to check your fasting blood sugar level. This should be done once every 3 years, after age 42, if you are within normal weight and without risk factors for diabetes. Testing should be considered at a younger age or be carried out more frequently if you are overweight and have at least 1 risk factor for diabetes.  Breast cancer screening is essential preventive care for women. You should  practice "breast self-awareness." This means understanding the normal appearance and feel of your breasts and may include breast self-examination. Any changes detected, no matter how small, should be reported to a caregiver. Women in their 77s and 30s should have a clinical breast exam (CBE) by a caregiver as part of a regular health exam every 1 to 3 years. After age 86, women should have a CBE every year. Starting at age 80, women should consider having a mammography (breast X-ray test) every year. Women who have a family history of breast cancer should talk to their caregiver about genetic screening. Women at a high risk of breast cancer should talk to their caregivers about having magnetic resonance imaging (MRI) and a mammography every year.  The Pap test is a screening test for cervical cancer. A Pap test can show cell changes on the cervix that might become cervical cancer if left untreated. A Pap test is a procedure in which cells are obtained and examined from the lower end of the uterus (cervix).  Women should have a Pap test starting at age 68.  Between ages 53 and 60, Pap tests should be repeated every 2 years.  Beginning at age 16, you should have a Pap test every 3 years as long as the past 3 Pap tests have been normal.  Some women have medical problems that increase the chance of getting cervical cancer. Talk to your caregiver about these problems. It is especially important to talk to your caregiver if a new problem develops soon after your last Pap test. In these cases, your caregiver may recommend more frequent screening and Pap tests.  The above recommendations are the same for women who have or have not gotten the vaccine for human papillomavirus (HPV).  If you had a hysterectomy for a problem that was not cancer or a condition that could lead to cancer, then you no longer need Pap tests. Even if you no longer need a Pap test, a regular exam is a good idea to make sure no other  problems are starting.  If you are between ages 20 and 57, and you have had normal Pap tests going back 10 years, you no longer need Pap tests. Even if you no longer need a Pap test, a regular exam is a good idea to make sure no other problems are starting.  If you have had past treatment for cervical cancer or a condition that could lead to cancer, you need Pap tests and screening for cancer for at least 20 years after your treatment.  If Pap tests have been discontinued, risk factors (such as a new sexual partner) need to be reassessed to determine if screening should be resumed.  The HPV test is an additional test that may be used for cervical cancer screening. The HPV test looks for the virus that can cause the cell changes on the cervix. The cells collected during the Pap test can be tested for HPV. The HPV test could be used to screen  women aged 60 years and older, and should be used in women of any age who have unclear Pap test results. After the age of 33, women should have HPV testing at the same frequency as a Pap test.  Colorectal cancer can be detected and often prevented. Most routine colorectal cancer screening begins at the age of 45 and continues through age 17. However, your caregiver may recommend screening at an earlier age if you have risk factors for colon cancer. On a yearly basis, your caregiver may provide home test kits to check for hidden blood in the stool. Use of a small camera at the end of a tube, to directly examine the colon (sigmoidoscopy or colonoscopy), can detect the earliest forms of colorectal cancer. Talk to your caregiver about this at age 76, when routine screening begins. Direct examination of the colon should be repeated every 5 to 10 years through age 11, unless early forms of pre-cancerous polyps or small growths are found.  Hepatitis C blood testing is recommended for all people born from [birth year] through 1965 and any individual with known risks for hepatitis  C.  Practice safe sex. Use condoms and avoid high-risk sexual practices to reduce the spread of sexually transmitted infections (STIs). STIs include gonorrhea, chlamydia, syphilis, trichomonas, herpes, HPV, and human immunodeficiency virus (HIV). Herpes, HIV, and HPV are viral illnesses that have no cure. They can result in disability, cancer, and death. Sexually active women aged 8 and younger should be checked for chlamydia. Older women with new or multiple partners should also be tested for chlamydia. Testing for other STIs is recommended if you are sexually active and at increased risk.  Osteoporosis is a disease in which the bones lose minerals and strength with aging. This can result in serious bone fractures. The risk of osteoporosis can be identified using a bone density scan. Women ages 44 and over and women at risk for fractures or osteoporosis should discuss screening with their caregivers. Ask your caregiver whether you should take a calcium supplement or vitamin D to reduce the rate of osteoporosis.  Menopause can be associated with physical symptoms and risks. Hormone replacement therapy is available to decrease symptoms and risks. You should talk to your caregiver about whether hormone replacement therapy is right for you.  Use sunscreen with sun protection factor (SPF) of 30 or more. Apply sunscreen liberally and repeatedly throughout the day. You should seek shade when your shadow is shorter than you. Protect yourself by wearing long sleeves, pants, a wide-brimmed hat, and sunglasses year round, whenever you are outdoors.  Once a month, do a whole body skin exam, using a mirror to look at the skin on your back. Notify your caregiver of new moles, moles that have irregular borders, moles that are larger than a pencil eraser, or moles that have changed in shape or color.  Stay current with required immunizations.  Influenza. You need a dose every fall (or winter). The composition of the  flu vaccine changes each year, so being vaccinated once is not enough.  Pneumococcal polysaccharide. You need 1 to 2 doses if you smoke cigarettes or if you have certain chronic medical conditions. You need 1 dose at age 39 (or older) if you have never been vaccinated.  Tetanus, diphtheria, pertussis (Tdap, Td). Get 1 dose of Tdap vaccine if you are younger than age 72, are over 75 and have contact with an infant, are a Research scientist (physical sciences), are pregnant, or simply want to be protected from  whooping cough. After that, you need a Td booster dose every 10 years. Consult your caregiver if you have not had at least 3 tetanus and diphtheria-containing shots sometime in your life or have a deep or dirty wound.  HPV. You need this vaccine if you are a woman age 90 or younger. The vaccine is given in 3 doses over 6 months.  Measles, mumps, rubella (MMR). You need at least 1 dose of MMR if you were born in 1957 or later. You may also need a second dose.  Meningococcal. If you are age 72 to 4 and a first-year college student living in a residence hall, or have one of several medical conditions, you need to get vaccinated against meningococcal disease. You may also need additional booster doses.  Zoster (shingles). If you are age 53 or older, you should get this vaccine.  Varicella (chickenpox). If you have never had chickenpox or you were vaccinated but received only 1 dose, talk to your caregiver to find out if you need this vaccine.  Hepatitis A. You need this vaccine if you have a specific risk factor for hepatitis A virus infection or you simply wish to be protected from this disease. The vaccine is usually given as 2 doses, 6 to 18 months apart.  Hepatitis B. You need this vaccine if you have a specific risk factor for hepatitis B virus infection or you simply wish to be protected from this disease. The vaccine is given in 3 doses, usually over 6 months. Preventive Services / Frequency Ages 28 to  67  Blood pressure check.** / Every 1 to 2 years.  Lipid and cholesterol check.** / Every 5 years beginning at age 50.  Clinical breast exam.** / Every 3 years for women in their 42s and 30s.  Pap test.** / Every 2 years from ages 52 through 70. Every 3 years starting at age 94 through age 62 or 93 with a history of 3 consecutive normal Pap tests.  HPV screening.** / Every 3 years from ages 65 through ages 97 to 40 with a history of 3 consecutive normal Pap tests.  Hepatitis C blood test.** / For any individual with known risks for hepatitis C.  Skin self-exam. / Monthly.  Influenza immunization.** / Every year.  Pneumococcal polysaccharide immunization.** / 1 to 2 doses if you smoke cigarettes or if you have certain chronic medical conditions.  Tetanus, diphtheria, pertussis (Tdap, Td) immunization. / A one-time dose of Tdap vaccine. After that, you need a Td booster dose every 10 years.  HPV immunization. / 3 doses over 6 months, if you are 41 and younger.  Measles, mumps, rubella (MMR) immunization. / You need at least 1 dose of MMR if you were born in 1957 or later. You may also need a second dose.  Meningococcal immunization. / 1 dose if you are age 18 to 41 and a first-year college student living in a residence hall, or have one of several medical conditions, you need to get vaccinated against meningococcal disease. You may also need additional booster doses.  Varicella immunization.** / Consult your caregiver.  Hepatitis A immunization.** / Consult your caregiver. 2 doses, 6 to 18 months apart.  Hepatitis B immunization.** / Consult your caregiver. 3 doses usually over 6 months. Ages 77 to 33  Blood pressure check.** / Every 1 to 2 years.  Lipid and cholesterol check.** / Every 5 years beginning at age 5.  Clinical breast exam.** / Every year after age 79.  Mammogram.** / Every year beginning at age 60 and continuing for as long as you are in good health. Consult with  your caregiver.  Pap test.** / Every 3 years starting at age 37 through age 31 or 27 with a history of 3 consecutive normal Pap tests.  HPV screening.** / Every 3 years from ages 7 through ages 9 to 39 with a history of 3 consecutive normal Pap tests.  Fecal occult blood test (FOBT) of stool. / Every year beginning at age 95 and continuing until age 66. You may not need to do this test if you get a colonoscopy every 10 years.  Flexible sigmoidoscopy or colonoscopy.** / Every 5 years for a flexible sigmoidoscopy or every 10 years for a colonoscopy beginning at age 56 and continuing until age 25.  Hepatitis C blood test.** / For all people born from [birth year] through 1965 and any individual with known risks for hepatitis C.  Skin self-exam. / Monthly.  Influenza immunization.** / Every year.  Pneumococcal polysaccharide immunization.** / 1 to 2 doses if you smoke cigarettes or if you have certain chronic medical conditions.  Tetanus, diphtheria, pertussis (Tdap, Td) immunization.** / A one-time dose of Tdap vaccine. After that, you need a Td booster dose every 10 years.  Measles, mumps, rubella (MMR) immunization. / You need at least 1 dose of MMR if you were born in 1957 or later. You may also need a second dose.  Varicella immunization.** / Consult your caregiver.  Meningococcal immunization.** / Consult your caregiver.  Hepatitis A immunization.** / Consult your caregiver. 2 doses, 6 to 18 months apart.  Hepatitis B immunization.** / Consult your caregiver. 3 doses, usually over 6 months. Ages 7 and over  Blood pressure check.** / Every 1 to 2 years.  Lipid and cholesterol check.** / Every 5 years beginning at age 37.  Clinical breast exam.** / Every year after age 20.  Mammogram.** / Every year beginning at age 61 and continuing for as long as you are in good health. Consult with your caregiver.  Pap test.** / Every 3 years starting at age 19 through age 70 or 4 with a 3  consecutive normal Pap tests. Testing can be stopped between 65 and 70 with 3 consecutive normal Pap tests and no abnormal Pap or HPV tests in the past 10 years.  HPV screening.** / Every 3 years from ages 17 through ages 49 or 12 with a history of 3 consecutive normal Pap tests. Testing can be stopped between 65 and 70 with 3 consecutive normal Pap tests and no abnormal Pap or HPV tests in the past 10 years.  Fecal occult blood test (FOBT) of stool. / Every year beginning at age 44 and continuing until age 71. You may not need to do this test if you get a colonoscopy every 10 years.  Flexible sigmoidoscopy or colonoscopy.** / Every 5 years for a flexible sigmoidoscopy or every 10 years for a colonoscopy beginning at age 56 and continuing until age 73.  Hepatitis C blood test.** / For all people born from [birth year] through 1963 and any individual with known risks for hepatitis C.  Osteoporosis screening.** / A one-time screening for women ages 79 and over and women at risk for fractures or osteoporosis.  Skin self-exam. / Monthly.  Influenza immunization.** / Every year.  Pneumococcal polysaccharide immunization.** / 1 dose at age 59 (or older) if you have never been vaccinated.  Tetanus, diphtheria, pertussis (Tdap, Td) immunization. / A one-time  dose of Tdap vaccine if you are over 65 and have contact with an infant, are a Research scientist (physical sciences), or simply want to be protected from whooping cough. After that, you need a Td booster dose every 10 years.  Varicella immunization.** / Consult your caregiver.  Meningococcal immunization.** / Consult your caregiver.  Hepatitis A immunization.** / Consult your caregiver. 2 doses, 6 to 18 months apart.  Hepatitis B immunization.** / Check with your caregiver. 3 doses, usually over 6 months. ** Family history and personal history of risk and conditions may change your caregiver's recommendations. Document Released: 06/20/2001 Document Revised: 07/17/2011  Document Reviewed: 09/19/2010 [facility] Patient Information 2014 [place], [place].

## 2013-03-03 NOTE — Progress Notes (Signed)
Subjective:     Judith [name] is a 57 y.o. female and is here for a comprehensive physical exam. The patient reports no problems.  History   Social History  . Marital Status: Married    Spouse Name: N/A    Number of Children: N/A  . Years of Education: N/A   Occupational History  . [employer]     accountant Pharmacologist   Social History Main Topics  . Smoking status: Former Smoker -- 0.20 packs/day for 5 years    Types: Cigarettes    Quit date: 09/09/1986  . Smokeless tobacco: Not on file  . Alcohol Use: 8.4 oz/week    14 Glasses of wine per week  . Drug Use: No  . Sexual Activity: Yes    Partners: Male   Other Topics Concern  . Not on file   Social History Narrative   Exercise--walk with dog   Health Maintenance  Topic Date Due  . Mammogram  01/21/2012  . Influenza Vaccine  12/06/2012  . Pap Smear  12/29/2012  . Colonoscopy  02/17/2020  . Tetanus/tdap  09/08/2021    The following portions of the patient's history were reviewed and updated as appropriate:  She  has no past medical history on file. She  does not have any pertinent problems on file. She  has no past surgical history on file. Her family history includes Arthritis in her father; Cancer in her father; Cancer (age of onset: 57) in her mother. She  reports that she quit smoking about 26 years ago. Her smoking use included Cigarettes. She has a 1 pack-year smoking history. She does not have any smokeless tobacco history on file. She reports that she drinks about 8.4 ounces of alcohol per week. She reports that she does not use illicit drugs. She has a current medication list which includes the following prescription(s): calcium carbonate, cholecalciferol, and vitamin c. Current Outpatient Prescriptions on File Prior to Visit  Medication Sig Dispense Refill  . calcium carbonate 200 MG capsule Take 250 mg by mouth 2 (two) times daily with a meal.      . cholecalciferol  (VITAMIN D) 1000 UNITS tablet Take 1,000 Units by mouth daily.      . vitamin C (ASCORBIC ACID) 500 MG tablet Take 500 mg by mouth daily.       No current facility-administered medications on file prior to visit.   She has No Known Allergies..  Review of Systems Review of Systems  Constitutional: Negative for activity change, appetite change and fatigue.  HENT: Negative for hearing loss, congestion, tinnitus and ear discharge.  dentist q29m Eyes: Negative for visual disturbance (see optho q1y -- vision corrected to 20/20 with glasses).  Respiratory: Negative for cough, chest tightness and shortness of breath.   Cardiovascular: Negative for chest pain, palpitations and leg swelling.  Gastrointestinal: Negative for abdominal pain, diarrhea, constipation and abdominal distention.  Genitourinary: Negative for urgency, frequency, decreased urine volume and difficulty urinating.  Musculoskeletal: Negative for back pain, arthralgias and gait problem.  Skin: Negative for color change, pallor and rash.  Neurological: Negative for dizziness, light-headedness, numbness and headaches.  Hematological: Negative for adenopathy. Does not bruise/bleed easily.  Psychiatric/Behavioral: Negative for suicidal ideas, confusion, sleep disturbance, self-injury, dysphoric mood, decreased concentration and agitation.       Objective:    BP 120/76  Pulse 99  Temp(Src) 98.8 F (37.1 C) (Oral)  Ht 5' 4.5" (1.638 m)  Wt 236 lb 12.8 oz (107.412  kg)  BMI 40.03 kg/m2  SpO2 97% General appearance: alert, cooperative, appears stated age and no distress Head: Normocephalic, without obvious abnormality, atraumatic Eyes: conjunctivae/corneas clear. PERRL, EOM's intact. Fundi benign. Ears: normal TM's and external ear canals both ears Nose: Nares normal. Septum midline. Mucosa normal. No drainage or sinus tenderness. Throat: lips, mucosa, and tongue normal; teeth and gums normal Neck: no adenopathy, no carotid  bruit, no JVD, supple, symmetrical, trachea midline and thyroid not enlarged, symmetric, no tenderness/mass/nodules Back: symmetric, no curvature. ROM normal. No CVA tenderness. Lungs: clear to auscultation bilaterally Breasts: normal appearance, no masses or tenderness Heart: regular rate and rhythm, S1, S2 normal, no murmur, click, rub or gallop Abdomen: soft, non-tender; bowel sounds normal; no masses,  no organomegaly Pelvic: cervix normal in appearance, external genitalia normal, no adnexal masses or tenderness, no cervical motion tenderness, rectovaginal septum normal, uterus normal size, shape, and consistency, vagina normal without discharge and pap done Extremities: extremities normal, atraumatic, no cyanosis or edema Pulses: 2+ and symmetric Skin: Skin color, texture, turgor normal. No rashes or lesions Lymph nodes: Cervical, supraclavicular, and axillary nodes normal. Neurologic: Alert and oriented X 3, normal strength and tone. Normal symmetric reflexes. Normal coordination and gait Psych- no depression, no anxiety      Assessment:    Healthy female exam.      Plan:    ghm utd Check labs See After Visit Summary for Counseling Recommendations

## 2013-03-04 LAB — URINE CULTURE
Colony Count: NO GROWTH
Organism ID, Bacteria: NO GROWTH

## 2013-03-07 LAB — VITAMIN D 1,25 DIHYDROXY
Vitamin D 1, 25 (OH)2 Total: 77 pg/mL — ABNORMAL HIGH (ref 18–72)
Vitamin D2 1, 25 (OH)2: <8 pg/mL
Vitamin D3 1, 25 (OH)2: 77 pg/mL

## 2013-04-16 ENCOUNTER — Ambulatory Visit: Payer: No Typology Code available for payment source

## 2013-04-18 ENCOUNTER — Ambulatory Visit: Payer: No Typology Code available for payment source

## 2013-04-25 ENCOUNTER — Ambulatory Visit: Payer: No Typology Code available for payment source

## 2013-05-08 DIAGNOSIS — E669 Obesity, unspecified: Secondary | ICD-10-CM

## 2013-05-08 HISTORY — DX: Obesity, unspecified: E66.9

## 2013-05-23 ENCOUNTER — Ambulatory Visit
Admission: RE | Admit: 2013-05-23 | Discharge: 2013-05-23 | Disposition: A | Discharge status: Home / Self Care | Payer: No Typology Code available for payment source | Source: Ambulatory Visit | Attending: Family Medicine | Admitting: Family Medicine

## 2013-05-23 DIAGNOSIS — Z1231 Encounter for screening mammogram for malignant neoplasm of breast: Secondary | ICD-10-CM

## 2014-07-02 ENCOUNTER — Ambulatory Visit (INDEPENDENT_AMBULATORY_CARE_PROVIDER_SITE_OTHER): Payer: PRIVATE HEALTH INSURANCE | Admitting: Sports Medicine

## 2014-07-02 VITALS — BP 128/88 | HR 80 | Temp 98.0°F | Resp 20 | Ht 64.25 in | Wt 246.0 lb

## 2014-07-02 DIAGNOSIS — B9789 Other viral agents as the cause of diseases classified elsewhere: Secondary | ICD-10-CM

## 2014-07-02 DIAGNOSIS — J069 Acute upper respiratory infection, unspecified: Secondary | ICD-10-CM

## 2014-07-02 DIAGNOSIS — J209 Acute bronchitis, unspecified: Secondary | ICD-10-CM

## 2014-07-02 MED ORDER — FLUTICASONE PROPIONATE 50 MCG/ACT NA SUSP: 2.0000 | Freq: Every day | NASAL | Status: DC

## 2014-07-02 MED ORDER — AZITHROMYCIN 250 MG PO TABS: ORAL_TABLET | ORAL | Status: DC

## 2014-07-02 MED ORDER — PSEUDOEPHED-APAP-GUAIFENESIN 30-325-200 MG PO TABS: 1.0000 | ORAL_TABLET | Freq: Four times a day (QID) | ORAL | Status: DC | PRN

## 2014-07-02 NOTE — Progress Notes (Signed)
  Judith [name] - 59 y.o. female MRN [id number]  Date of birth: Jan 05, 1956  SUBJECTIVE: CC:  Chief Complaint  Patient presents with  . Cough    with Chest Congestion  . Nasal Congestion    HPI: 5 days of worsening sx  Initially started with nasal congestion. Progressed to nonproductive cough first thing in the morning.  Now having worsening bilateral frontal and maxillary pressure.  Taking OTC Alka-Seltzer cold and sinus  No difficulty breathing, no chest pain, no significant wheezing or shortness of breath.  No fevers, chills, rigors  No abdominal pain, diarrhea, constipation  No dysuria, frequency or urinary hesitancy   ROS: per HPI  HISTORY:  Past Medical, Surgical, Social, and Family History reviewed & updated per EMR.  Pertinent Historical Findings include:  reports that she quit smoking about 27 years ago. Her smoking use included Cigarettes. She has a 1 pack-year smoking history. She has never used smokeless tobacco. Otherwise Healthy   OBJECTIVE:  VS:   HT:5' 4.25" (163.2 cm)   WT:246 lb (111.585 kg)  BMI:42          BP:128/88 mmHg  HR:80bpm  TEMP:98 F (36.7 C)(Oral)  RESP:97 %  PHYSICAL EXAM:  Physical Exam  Constitutional: She is well-developed, well-nourished, and in no distress. No distress.  HENT:  Head: Normocephalic and atraumatic.  Right Ear: External ear normal.  Left Ear: External ear normal.  Eyes: Right eye exhibits no discharge. Left eye exhibits no discharge. No scleral icterus.  Neck: No JVD present. No tracheal deviation present.  Cardiovascular: Normal rate, regular rhythm and normal heart sounds.  Exam reveals no gallop and no friction rub.   No murmur heard. Pulmonary/Chest: Effort normal and breath sounds normal. No respiratory distress. She has no wheezes. She exhibits no tenderness.  Abdominal: Soft.  Musculoskeletal: She exhibits no edema or tenderness.  Neurological: She is alert.  Moves all 4 extremities  spontaneously; no lateralization.  Skin: Skin is warm and dry. She is not diaphoretic.  Psychiatric: Mood, memory, affect and judgment normal.  Vitals reviewed.  ASSESSMENT: 1. Acute bronchitis, unspecified organism   2. Viral URI with cough    Likely viral syndrome, worsening however after fifth day concerning for potential secondary bacterial infection  PLAN: See problem based charting & AVS for additional documentation.  Discussed appropriate symptomatic treatment meds per AVS  Azithromycin printed for patient. She will fill if any worsening after the next 3 days > Return if symptoms worsen or fail to improve.

## 2014-07-02 NOTE — Patient Instructions (Signed)
Take 2 Aleeve with Breakfast and 2 with Dinner for the next 3 days Use NASAL SALINE AS NEEDED  You have viral infection that will resolve on its own over time.  Typically, symptoms can last for up to 10 days (or more) but should be continually improving after the 5th day.  If you exerpience worsening after the 7th day please call us back.  Please continue to drink plenty of fluids and remember you and everybody in your household need to wash their hands frequently!  Honey has been shown to help with cough.  You can try mixing  a teaspoon of honey in warm water or caffeine free herbal tea before bedtime.  If you are worsening after Sunday okay to fill the prescription for the Anti-biotic

## 2014-07-04 NOTE — Progress Notes (Signed)
I have reviewed and agree with plan of care by Dr. Christie Beckers Copland

## 2015-03-22 ENCOUNTER — Encounter: Payer: Self-pay | Admitting: Gastroenterology

## 2015-03-24 ENCOUNTER — Ambulatory Visit (INDEPENDENT_AMBULATORY_CARE_PROVIDER_SITE_OTHER): Payer: 59 | Admitting: Physician Assistant

## 2015-03-24 VITALS — BP 120/80 | HR 85 | Temp 98.1°F | Resp 16 | Ht 64.25 in | Wt 246.0 lb

## 2015-03-24 DIAGNOSIS — J069 Acute upper respiratory infection, unspecified: Secondary | ICD-10-CM | POA: Diagnosis not present

## 2015-03-24 MED ORDER — BENZONATATE 100 MG PO CAPS: 100.0000 mg | ORAL_CAPSULE | Freq: Three times a day (TID) | ORAL | Status: DC | PRN

## 2015-03-24 MED ORDER — GUAIFENESIN ER 1200 MG PO TB12: 1.0000 | ORAL_TABLET | Freq: Two times a day (BID) | ORAL | Status: DC | PRN

## 2015-03-24 NOTE — Progress Notes (Signed)
[facility] [address], [place] [address] [phone]  Date:  03/24/2015   Name:  Judith [name]   DOB:  08-03-1955   MRN:  [id number]  PCP:  Garnet Koyanagi, DO    History of Present Illness:  Judith [name] is a 59 y.o. female patient who presents to [facility] for cc of throat pain and non-productive cough. Sore thraot 3: 30 am one week ago.  Nasal congestion started the next day, that appeared to settle into the chest.  She has cough that is non-productive, but a nuisance.  She has no fever, sob, or dyspnea.   Voice is hoarse.  She has used severe congestion and cold.   When she blows, there there is heavy thick mucus, however she denies sinus pressure.  She feels as if her symptoms are resolving, but employer is bothered by her cough and risk of contagious.   Patient Active Problem List   Diagnosis Date Noted  . Obesity (BMI 30-39.9) 03/03/2013  . SINUSITIS - ACUTE-NOS 01/14/2010  . POSTMENOPAUSAL STATUS 12/29/2009    History reviewed. No pertinent past medical history.  History reviewed. No pertinent past surgical history.  Social History  Substance Use Topics  . Smoking status: Former Smoker -- 0.20 packs/day for 5 years    Types: Cigarettes    Quit date: 09/09/1986  . Smokeless tobacco: Never Used  . Alcohol Use: 8.4 oz/week    14 Glasses of wine per week    Family History  Problem Relation Age of Onset  . Cancer Mother 26    lymphoma  . Cancer Father     renal 1969, 1999  . Arthritis Father     No Known Allergies  Medication list has been reviewed and updated.  Current Outpatient Prescriptions on File Prior to Visit  Medication Sig Dispense Refill  . Calcium Carbonate-Vitamin D (CALCIUM + D PO) Take 1 tablet by mouth daily.    . Pseudoephed-APAP-Guaifenesin 30-325-200 MG TABS Take 1 tablet by mouth every 6 (six) hours as needed. 30 each 0  . vitamin C (ASCORBIC ACID) 500 MG tablet Take 500 mg by mouth daily.     . fluticasone (FLONASE) 50 MCG/ACT nasal spray Place 2 sprays into both nostrils daily. (Patient not taking: Reported on 03/24/2015) 16 g 1   No current facility-administered medications on file prior to visit.    ROS ROS otherwise unremarkable unless listed above.    Physical Examination: BP 120/80 mmHg  Pulse 85  Temp(Src) 98.1 F (36.7 C) (Oral)  Resp 16  Ht 5' 4.25" (1.632 m)  Wt 246 lb (111.585 kg)  BMI 41.90 kg/m2  SpO2 97% Ideal Body Weight: Weight in (lb) to have BMI = 25: 146.5  Physical Exam  Constitutional: She is oriented to person, place, and time. She appears well-developed and well-nourished. No distress.  HENT:  Head: Normocephalic and atraumatic.  Right Ear: Tympanic membrane, external ear and ear canal normal.  Left Ear: Tympanic membrane, external ear and ear canal normal.  Nose: Mucosal edema and rhinorrhea present. Right sinus exhibits no maxillary sinus tenderness and no frontal sinus tenderness. Left sinus exhibits no maxillary sinus tenderness and no frontal sinus tenderness.  Mouth/Throat: No uvula swelling. No oropharyngeal exudate, posterior oropharyngeal edema or posterior oropharyngeal erythema.  Eyes: Conjunctivae and EOM are normal. Pupils are equal, round, and reactive to light.  Cardiovascular: Normal rate and regular rhythm.  Exam reveals no gallop, no distant heart sounds and no friction rub.  No murmur heard. Pulmonary/Chest: Effort normal. No respiratory distress. She has no decreased breath sounds. She has no wheezes. She has no rhonchi.  Lymphadenopathy:       Head (right side): No submandibular, no tonsillar, no preauricular and no posterior auricular adenopathy present.       Head (left side): No submandibular, no tonsillar, no preauricular and no posterior auricular adenopathy present.  Neurological: She is alert and oriented to person, place, and time.  Skin: She is not diaphoretic.  Psychiatric: She has a normal mood and affect. Her  behavior is normal.     Assessment and Plan: Judith [name] is a 59 y.o. female who is here today for cough and throat pain.  This appears to be vastly resolving.  I have advised that we will start an antibiotic if she takes a turn.  Acute upper respiratory infection - Plan: benzonatate (TESSALON) 100 MG capsule, Guaifenesin (MUCINEX MAXIMUM STRENGTH) 1200 MG TB12  Ivar Drape, PA-C [facility] 03/24/2015 6:52 PM

## 2015-03-24 NOTE — Patient Instructions (Signed)
Please continue to hydrate with over 64 oz of water per day.  Upper Respiratory Infection, Adult Most upper respiratory infections (URIs) are a viral infection of the air passages leading to the lungs. A URI affects the nose, throat, and upper air passages. The most common type of URI is nasopharyngitis and is typically referred to as "the common cold." URIs run their course and usually go away on their own. Most of the time, a URI does not require medical attention, but sometimes a bacterial infection in the upper airways can follow a viral infection. This is called a secondary infection. Sinus and middle ear infections are common types of secondary upper respiratory infections. Bacterial pneumonia can also complicate a URI. A URI can worsen asthma and chronic obstructive pulmonary disease (COPD). Sometimes, these complications can require emergency medical care and may be life threatening.  CAUSES Almost all URIs are caused by viruses. A virus is a type of germ and can spread from one person to another.  RISKS FACTORS You may be at risk for a URI if:   You smoke.   You have chronic heart or lung disease.  You have a weakened defense (immune) system.   You are very young or very old.   You have nasal allergies or asthma.  You work in crowded or poorly ventilated areas.  You work in health care facilities or schools. SIGNS AND SYMPTOMS  Symptoms typically develop 2-3 days after you come in contact with a cold virus. Most viral URIs last 7-10 days. However, viral URIs from the influenza virus (flu virus) can last 14-18 days and are typically more severe. Symptoms may include:   Runny or stuffy (congested) nose.   Sneezing.   Cough.   Sore throat.   Headache.   Fatigue.   Fever.   Loss of appetite.   Pain in your forehead, behind your eyes, and over your cheekbones (sinus pain).  Muscle aches.  DIAGNOSIS  Your health care provider may diagnose a URI  by:  Physical exam.  Tests to check that your symptoms are not due to another condition such as:  Strep throat.  Sinusitis.  Pneumonia.  Asthma. TREATMENT  A URI goes away on its own with time. It cannot be cured with medicines, but medicines may be prescribed or recommended to relieve symptoms. Medicines may help:  Reduce your fever.  Reduce your cough.  Relieve nasal congestion. HOME CARE INSTRUCTIONS   Take medicines only as directed by your health care provider.   Gargle warm saltwater or take cough drops to comfort your throat as directed by your health care provider.  Use a warm mist humidifier or inhale steam from a shower to increase air moisture. This may make it easier to breathe.  Drink enough fluid to keep your urine clear or pale yellow.   Eat soups and other clear broths and maintain good nutrition.   Rest as needed.   Return to work when your temperature has returned to normal or as your health care provider advises. You may need to stay home longer to avoid infecting others. You can also use a face mask and careful hand washing to prevent spread of the virus.  Increase the usage of your inhaler if you have asthma.   Do not use any tobacco products, including cigarettes, chewing tobacco, or electronic cigarettes. If you need help quitting, ask your health care provider. PREVENTION  The best way to protect yourself from getting a cold is to practice  good hygiene.   Avoid oral or hand contact with people with cold symptoms.   Wash your hands often if contact occurs.  There is no clear evidence that vitamin C, vitamin E, echinacea, or exercise reduces the chance of developing a cold. However, it is always recommended to get plenty of rest, exercise, and practice good nutrition.  SEEK MEDICAL CARE IF:   You are getting worse rather than better.   Your symptoms are not controlled by medicine.   You have chills.  You have worsening shortness of  breath.  You have brown or red mucus.  You have yellow or brown nasal discharge.  You have pain in your face, especially when you bend forward.  You have a fever.  You have swollen neck glands.  You have pain while swallowing.  You have white areas in the back of your throat. SEEK IMMEDIATE MEDICAL CARE IF:   You have severe or persistent:  Headache.  Ear pain.  Sinus pain.  Chest pain.  You have chronic lung disease and any of the following:  Wheezing.  Prolonged cough.  Coughing up blood.  A change in your usual mucus.  You have a stiff neck.  You have changes in your:  Vision.  Hearing.  Thinking.  Mood. MAKE SURE YOU:   Understand these instructions.  Will watch your condition.  Will get help right away if you are not doing well or get worse.   This information is not intended to replace advice given to you by your health care provider. Make sure you discuss any questions you have with your health care provider.   Document Released: 10/18/2000 Document Revised: 09/08/2014 Document Reviewed: 07/30/2013 Elsevier Interactive Patient Education [employer].

## 2015-07-08 ENCOUNTER — Telehealth: Payer: Self-pay | Admitting: Family Medicine

## 2015-07-08 NOTE — Telephone Encounter (Signed)
lvm for pt.  °

## 2015-10-07 ENCOUNTER — Telehealth: Payer: Self-pay | Admitting: *Deleted

## 2015-10-07 NOTE — Telephone Encounter (Signed)
Unable to reach patient at time of pre-visit call. 

## 2015-10-08 ENCOUNTER — Ambulatory Visit (INDEPENDENT_AMBULATORY_CARE_PROVIDER_SITE_OTHER): Payer: BLUE CROSS/BLUE SHIELD | Admitting: Family Medicine

## 2015-10-08 ENCOUNTER — Encounter: Payer: Self-pay | Admitting: Family Medicine

## 2015-10-08 ENCOUNTER — Other Ambulatory Visit (HOSPITAL_COMMUNITY)
Admission: RE | Admit: 2015-10-08 | Discharge: 2015-10-08 | Disposition: A | Discharge status: Home / Self Care | Payer: BLUE CROSS/BLUE SHIELD | Source: Ambulatory Visit | Attending: Family Medicine | Admitting: Family Medicine

## 2015-10-08 VITALS — BP 126/80 | HR 68 | Temp 97.9°F | Ht 64.0 in | Wt 244.0 lb

## 2015-10-08 DIAGNOSIS — Z124 Encounter for screening for malignant neoplasm of cervix: Secondary | ICD-10-CM | POA: Diagnosis not present

## 2015-10-08 DIAGNOSIS — Z1159 Encounter for screening for other viral diseases: Secondary | ICD-10-CM | POA: Diagnosis not present

## 2015-10-08 DIAGNOSIS — Z1151 Encounter for screening for human papillomavirus (HPV): Secondary | ICD-10-CM | POA: Diagnosis present

## 2015-10-08 DIAGNOSIS — R8290 Unspecified abnormal findings in urine: Secondary | ICD-10-CM | POA: Diagnosis not present

## 2015-10-08 DIAGNOSIS — Z1231 Encounter for screening mammogram for malignant neoplasm of breast: Secondary | ICD-10-CM

## 2015-10-08 DIAGNOSIS — Z01419 Encounter for gynecological examination (general) (routine) without abnormal findings: Secondary | ICD-10-CM | POA: Insufficient documentation

## 2015-10-08 DIAGNOSIS — Z0000 Encounter for general adult medical examination without abnormal findings: Secondary | ICD-10-CM | POA: Diagnosis not present

## 2015-10-08 LAB — POCT URINALYSIS DIPSTICK
Bilirubin, UA: NEGATIVE
Glucose, UA: NEGATIVE
Ketones, UA: NEGATIVE
NITRITE UA: NEGATIVE
PH UA: 6
PROTEIN UA: NEGATIVE
RBC UA: NEGATIVE
Spec Grav, UA: 1.025
UROBILINOGEN UA: NEGATIVE

## 2015-10-08 LAB — CBC WITH DIFFERENTIAL/PLATELET
BASOS ABS: 0 10*3/uL (ref 0.0–0.1)
Basophils Relative: 0.5 % (ref 0.0–3.0)
EOS PCT: 3.5 % (ref 0.0–5.0)
Eosinophils Absolute: 0.3 10*3/uL (ref 0.0–0.7)
HEMATOCRIT: 43.2 % (ref 36.0–46.0)
Hemoglobin: 14.6 g/dL (ref 12.0–15.0)
Lymphocytes Relative: 33.7 % (ref 12.0–46.0)
Lymphs Abs: 2.6 10*3/uL (ref 0.7–4.0)
MCHC: 33.7 g/dL (ref 30.0–36.0)
MCV: 89.7 fl (ref 78.0–100.0)
Monocytes Absolute: 0.5 10*3/uL (ref 0.1–1.0)
Monocytes Relative: 5.8 % (ref 3.0–12.0)
NEUTROS ABS: 4.4 10*3/uL (ref 1.4–7.7)
Neutrophils Relative %: 56.5 % (ref 43.0–77.0)
PLATELETS: 353 10*3/uL (ref 150.0–400.0)
RBC: 4.81 Mil/uL (ref 3.87–5.11)
RDW: 13.9 % (ref 11.5–15.5)
WBC: 7.8 10*3/uL (ref 4.0–10.5)

## 2015-10-08 LAB — COMPREHENSIVE METABOLIC PANEL
ALBUMIN: 4.1 g/dL (ref 3.5–5.2)
ALK PHOS: 89 U/L (ref 39–117)
ALT: 17 U/L (ref 0–35)
AST: 14 U/L (ref 0–37)
BILIRUBIN TOTAL: 0.4 mg/dL (ref 0.2–1.2)
BUN: 23 mg/dL (ref 6–23)
CO2: 23 mEq/L (ref 19–32)
Calcium: 9.3 mg/dL (ref 8.4–10.5)
Chloride: 109 mEq/L (ref 96–112)
Creatinine, Ser: 0.65 mg/dL (ref 0.40–1.20)
GFR: 99.63 mL/min (ref >60.00)
GLUCOSE: 108 mg/dL — AB (ref 70–99)
Potassium: 4.1 mEq/L (ref 3.5–5.1)
SODIUM: 141 meq/L (ref 135–145)
Total Protein: 7 g/dL (ref 6.0–8.3)

## 2015-10-08 LAB — LIPID PANEL
CHOL/HDL RATIO: 3
CHOLESTEROL: 185 mg/dL (ref 0–200)
HDL: 53.3 mg/dL (ref >39.00)
LDL CALC: 119 mg/dL — AB (ref 0–99)
NONHDL: 131.8
TRIGLYCERIDES: 65 mg/dL (ref 0.0–149.0)
VLDL: 13 mg/dL (ref 0.0–40.0)

## 2015-10-08 LAB — TSH: TSH: 1.88 u[IU]/mL (ref 0.35–4.50)

## 2015-10-08 LAB — HEPATITIS C ANTIBODY: HCV AB: NEGATIVE

## 2015-10-08 NOTE — Patient Instructions (Signed)
Preventive Care for Adults, Female A healthy lifestyle and preventive care can promote health and wellness. Preventive health guidelines for women include the following key practices.  A routine yearly physical is a good way to check with your health care provider about your health and preventive screening. It is a chance to share any concerns and updates on your health and to receive a thorough exam.  Visit your dentist for a routine exam and preventive care every 6 months. Brush your teeth twice a day and floss once a day. Good oral hygiene prevents tooth decay and gum disease.  The frequency of eye exams is based on your age, health, family medical history, use of contact lenses, and other factors. Follow your health care provider's recommendations for frequency of eye exams.  Eat a healthy diet. Foods like vegetables, fruits, whole grains, low-fat dairy products, and lean protein foods contain the nutrients you need without too many calories. Decrease your intake of foods high in solid fats, added sugars, and salt. Eat the right amount of calories for you.Get information about a proper diet from your health care provider, if necessary.  Regular physical exercise is one of the most important things you can do for your health. Most adults should get at least 150 minutes of moderate-intensity exercise (any activity that increases your heart rate and causes you to sweat) each week. In addition, most adults need muscle-strengthening exercises on 2 or more days a week.  Maintain a healthy weight. The body mass index (BMI) is a screening tool to identify possible weight problems. It provides an estimate of body fat based on height and weight. Your health care provider can find your BMI and can help you achieve or maintain a healthy weight.For adults 20 years and older:  A BMI below 18.5 is considered underweight.  A BMI of 18.5 to 24.9 is normal.  A BMI of 25 to 29.9 is considered overweight.  A  BMI of 30 and above is considered obese.  Maintain normal blood lipids and cholesterol levels by exercising and minimizing your intake of saturated fat. Eat a balanced diet with plenty of fruit and vegetables. Blood tests for lipids and cholesterol should begin at age 45 and be repeated every 5 years. If your lipid or cholesterol levels are high, you are over 50, or you are at high risk for heart disease, you may need your cholesterol levels checked more frequently.Ongoing high lipid and cholesterol levels should be treated with medicines if diet and exercise are not working.  If you smoke, find out from your health care provider how to quit. If you do not use tobacco, do not start.  Lung cancer screening is recommended for adults aged 45-80 years who are at high risk for developing lung cancer because of a history of smoking. A yearly low-dose CT scan of the lungs is recommended for people who have at least a 30-pack-year history of smoking and are a current smoker or have quit within the past 15 years. A pack year of smoking is smoking an average of 1 pack of cigarettes a day for 1 year (for example: 1 pack a day for 30 years or 2 packs a day for 15 years). Yearly screening should continue until the smoker has stopped smoking for at least 15 years. Yearly screening should be stopped for people who develop a health problem that would prevent them from having lung cancer treatment.  If you are pregnant, do not drink alcohol. If you are  breastfeeding, be very cautious about drinking alcohol. If you are not pregnant and choose to drink alcohol, do not have more than 1 drink per day. One drink is considered to be 12 ounces (355 mL) of beer, 5 ounces (148 mL) of wine, or 1.5 ounces (44 mL) of liquor.  Avoid use of street drugs. Do not share needles with anyone. Ask for help if you need support or instructions about stopping the use of drugs.  High blood pressure causes heart disease and increases the risk  of stroke. Your blood pressure should be checked at least every 1 to 2 years. Ongoing high blood pressure should be treated with medicines if weight loss and exercise do not work.  If you are 55-79 years old, ask your health care provider if you should take aspirin to prevent strokes.  Diabetes screening is done by taking a blood sample to check your blood glucose level after you have not eaten for a certain period of time (fasting). If you are not overweight and you do not have risk factors for diabetes, you should be screened once every 3 years starting at age 45. If you are overweight or obese and you are 40-70 years of age, you should be screened for diabetes every year as part of your cardiovascular risk assessment.  Breast cancer screening is essential preventive care for women. You should practice "breast self-awareness." This means understanding the normal appearance and feel of your breasts and may include breast self-examination. Any changes detected, no matter how small, should be reported to a health care provider. Women in their 20s and 30s should have a clinical breast exam (CBE) by a health care provider as part of a regular health exam every 1 to 3 years. After age 40, women should have a CBE every year. Starting at age 40, women should consider having a mammogram (breast X-ray test) every year. Women who have a family history of breast cancer should talk to their health care provider about genetic screening. Women at a high risk of breast cancer should talk to their health care providers about having an MRI and a mammogram every year.  Breast cancer gene (BRCA)-related cancer risk assessment is recommended for women who have family members with BRCA-related cancers. BRCA-related cancers include breast, ovarian, tubal, and peritoneal cancers. Having family members with these cancers may be associated with an increased risk for harmful changes (mutations) in the breast cancer genes BRCA1 and  BRCA2. Results of the assessment will determine the need for genetic counseling and BRCA1 and BRCA2 testing.  Your health care provider may recommend that you be screened regularly for cancer of the pelvic organs (ovaries, uterus, and vagina). This screening involves a pelvic examination, including checking for microscopic changes to the surface of your cervix (Pap test). You may be encouraged to have this screening done every 3 years, beginning at age 21.  For women ages 30-65, health care providers may recommend pelvic exams and Pap testing every 3 years, or they may recommend the Pap and pelvic exam, combined with testing for human papilloma virus (HPV), every 5 years. Some types of HPV increase your risk of cervical cancer. Testing for HPV may also be done on women of any age with unclear Pap test results.  Other health care providers may not recommend any screening for nonpregnant women who are considered low risk for pelvic cancer and who do not have symptoms. Ask your health care provider if a screening pelvic exam is right for   you.  If you have had past treatment for cervical cancer or a condition that could lead to cancer, you need Pap tests and screening for cancer for at least 20 years after your treatment. If Pap tests have been discontinued, your risk factors (such as having a new sexual partner) need to be reassessed to determine if screening should resume. Some women have medical problems that increase the chance of getting cervical cancer. In these cases, your health care provider may recommend more frequent screening and Pap tests.  Colorectal cancer can be detected and often prevented. Most routine colorectal cancer screening begins at the age of 50 years and continues through age 75 years. However, your health care provider may recommend screening at an earlier age if you have risk factors for colon cancer. On a yearly basis, your health care provider may provide home test kits to check  for hidden blood in the stool. Use of a small camera at the end of a tube, to directly examine the colon (sigmoidoscopy or colonoscopy), can detect the earliest forms of colorectal cancer. Talk to your health care provider about this at age 50, when routine screening begins. Direct exam of the colon should be repeated every 5-10 years through age 75 years, unless early forms of precancerous polyps or small growths are found.  People who are at an increased risk for hepatitis B should be screened for this virus. You are considered at high risk for hepatitis B if:  You were born in a country where hepatitis B occurs often. Talk with your health care provider about which countries are considered high risk.  Your parents were born in a high-risk country and you have not received a shot to protect against hepatitis B (hepatitis B vaccine).  You have HIV or AIDS.  You use needles to inject street drugs.  You live with, or have sex with, someone who has hepatitis B.  You get hemodialysis treatment.  You take certain medicines for conditions like cancer, organ transplantation, and autoimmune conditions.  Hepatitis C blood testing is recommended for all people born from 1945 through 1965 and any individual with known risks for hepatitis C.  Practice safe sex. Use condoms and avoid high-risk sexual practices to reduce the spread of sexually transmitted infections (STIs). STIs include gonorrhea, chlamydia, syphilis, trichomonas, herpes, HPV, and human immunodeficiency virus (HIV). Herpes, HIV, and HPV are viral illnesses that have no cure. They can result in disability, cancer, and death.  You should be screened for sexually transmitted illnesses (STIs) including gonorrhea and chlamydia if:  You are sexually active and are younger than 24 years.  You are older than 24 years and your health care provider tells you that you are at risk for this type of infection.  Your sexual activity has changed  since you were last screened and you are at an increased risk for chlamydia or gonorrhea. Ask your health care provider if you are at risk.  If you are at risk of being infected with HIV, it is recommended that you take a prescription medicine daily to prevent HIV infection. This is called preexposure prophylaxis (PrEP). You are considered at risk if:  You are sexually active and do not regularly use condoms or know the HIV status of your partner(s).  You take drugs by injection.  You are sexually active with a partner who has HIV.  Talk with your health care provider about whether you are at high risk of being infected with HIV. If   you choose to begin PrEP, you should first be tested for HIV. You should then be tested every 3 months for as long as you are taking PrEP.  Osteoporosis is a disease in which the bones lose minerals and strength with aging. This can result in serious bone fractures or breaks. The risk of osteoporosis can be identified using a bone density scan. Women ages 67 years and over and women at risk for fractures or osteoporosis should discuss screening with their health care providers. Ask your health care provider whether you should take a calcium supplement or vitamin D to reduce the rate of osteoporosis.  Menopause can be associated with physical symptoms and risks. Hormone replacement therapy is available to decrease symptoms and risks. You should talk to your health care provider about whether hormone replacement therapy is right for you.  Use sunscreen. Apply sunscreen liberally and repeatedly throughout the day. You should seek shade when your shadow is shorter than you. Protect yourself by wearing long sleeves, pants, a wide-brimmed hat, and sunglasses year round, whenever you are outdoors.  Once a month, do a whole body skin exam, using a mirror to look at the skin on your back. Tell your health care provider of new moles, moles that have irregular borders, moles that  are larger than a pencil eraser, or moles that have changed in shape or color.  Stay current with required vaccines (immunizations).  Influenza vaccine. All adults should be immunized every year.  Tetanus, diphtheria, and acellular pertussis (Td, Tdap) vaccine. Pregnant women should receive 1 dose of Tdap vaccine during each pregnancy. The dose should be obtained regardless of the length of time since the last dose. Immunization is preferred during the 27th-36th week of gestation. An adult who has not previously received Tdap or who does not know her vaccine status should receive 1 dose of Tdap. This initial dose should be followed by tetanus and diphtheria toxoids (Td) booster doses every 10 years. Adults with an unknown or incomplete history of completing a 3-dose immunization series with Td-containing vaccines should begin or complete a primary immunization series including a Tdap dose. Adults should receive a Td booster every 10 years.  Varicella vaccine. An adult without evidence of immunity to varicella should receive 2 doses or a second dose if she has previously received 1 dose. Pregnant females who do not have evidence of immunity should receive the first dose after pregnancy. This first dose should be obtained before leaving the health care facility. The second dose should be obtained 4-8 weeks after the first dose.  Human papillomavirus (HPV) vaccine. Females aged 13-26 years who have not received the vaccine previously should obtain the 3-dose series. The vaccine is not recommended for use in pregnant females. However, pregnancy testing is not needed before receiving a dose. If a female is found to be pregnant after receiving a dose, no treatment is needed. In that case, the remaining doses should be delayed until after the pregnancy. Immunization is recommended for any person with an immunocompromised condition through the age of 61 years if she did not get any or all doses earlier. During the  3-dose series, the second dose should be obtained 4-8 weeks after the first dose. The third dose should be obtained 24 weeks after the first dose and 16 weeks after the second dose.  Zoster vaccine. One dose is recommended for adults aged 30 years or older unless certain conditions are present.  Measles, mumps, and rubella (MMR) vaccine. Adults born  before 1957 generally are considered immune to measles and mumps. Adults born in 1957 or later should have 1 or more doses of MMR vaccine unless there is a contraindication to the vaccine or there is laboratory evidence of immunity to each of the three diseases. A routine second dose of MMR vaccine should be obtained at least 28 days after the first dose for students attending postsecondary schools, health care workers, or international travelers. People who received inactivated measles vaccine or an unknown type of measles vaccine during 1963-1967 should receive 2 doses of MMR vaccine. People who received inactivated mumps vaccine or an unknown type of mumps vaccine before 1979 and are at high risk for mumps infection should consider immunization with 2 doses of MMR vaccine. For females of childbearing age, rubella immunity should be determined. If there is no evidence of immunity, females who are not pregnant should be vaccinated. If there is no evidence of immunity, females who are pregnant should delay immunization until after pregnancy. Unvaccinated health care workers born before 1957 who lack laboratory evidence of measles, mumps, or rubella immunity or laboratory confirmation of disease should consider measles and mumps immunization with 2 doses of MMR vaccine or rubella immunization with 1 dose of MMR vaccine.  Pneumococcal 13-valent conjugate (PCV13) vaccine. When indicated, a person who is uncertain of his immunization history and has no record of immunization should receive the PCV13 vaccine. All adults 65 years of age and older should receive this  vaccine. An adult aged 19 years or older who has certain medical conditions and has not been previously immunized should receive 1 dose of PCV13 vaccine. This PCV13 should be followed with a dose of pneumococcal polysaccharide (PPSV23) vaccine. Adults who are at high risk for pneumococcal disease should obtain the PPSV23 vaccine at least 8 weeks after the dose of PCV13 vaccine. Adults older than 58 years of age who have normal immune system function should obtain the PPSV23 vaccine dose at least 1 year after the dose of PCV13 vaccine.  Pneumococcal polysaccharide (PPSV23) vaccine. When PCV13 is also indicated, PCV13 should be obtained first. All adults aged 65 years and older should be immunized. An adult younger than age 65 years who has certain medical conditions should be immunized. Any person who resides in a nursing home or long-term care facility should be immunized. An adult smoker should be immunized. People with an immunocompromised condition and certain other conditions should receive both PCV13 and PPSV23 vaccines. People with human immunodeficiency virus (HIV) infection should be immunized as soon as possible after diagnosis. Immunization during chemotherapy or radiation therapy should be avoided. Routine use of PPSV23 vaccine is not recommended for American Indians, [place] Natives, or people younger than 65 years unless there are medical conditions that require PPSV23 vaccine. When indicated, people who have unknown immunization and have no record of immunization should receive PPSV23 vaccine. One-time revaccination 5 years after the first dose of PPSV23 is recommended for people aged 19-64 years who have chronic kidney failure, nephrotic syndrome, asplenia, or immunocompromised conditions. People who received 1-2 doses of PPSV23 before age 65 years should receive another dose of PPSV23 vaccine at age 65 years or later if at least 5 years have passed since the previous dose. Doses of PPSV23 are not  needed for people immunized with PPSV23 at or after age 65 years.  Meningococcal vaccine. Adults with asplenia or persistent complement component deficiencies should receive 2 doses of quadrivalent meningococcal conjugate (MenACWY-D) vaccine. The doses should be obtained   at least 2 months apart. Microbiologists working with certain meningococcal bacteria, [place] recruits, people at risk during an outbreak, and people who travel to or live in countries with a high rate of meningitis should be immunized. A first-year college student up through age 34 years who is living in a residence hall should receive a dose if she did not receive a dose on or after her 16th birthday. Adults who have certain high-risk conditions should receive one or more doses of vaccine.  Hepatitis A vaccine. Adults who wish to be protected from this disease, have certain high-risk conditions, work with hepatitis A-infected animals, work in hepatitis A research labs, or travel to or work in countries with a high rate of hepatitis A should be immunized. Adults who were previously unvaccinated and who anticipate close contact with an international adoptee during the first 60 days after arrival in the [place] from a country with a high rate of hepatitis A should be immunized.  Hepatitis B vaccine. Adults who wish to be protected from this disease, have certain high-risk conditions, may be exposed to blood or other infectious body fluids, are household contacts or sex partners of hepatitis B positive people, are clients or workers in certain care facilities, or travel to or work in countries with a high rate of hepatitis B should be immunized.  Haemophilus influenzae type b (Hib) vaccine. A previously unvaccinated person with asplenia or sickle cell disease or having a scheduled splenectomy should receive 1 dose of Hib vaccine. Regardless of previous immunization, a recipient of a hematopoietic stem cell transplant should receive a  3-dose series 6-12 months after her successful transplant. Hib vaccine is not recommended for adults with HIV infection. Preventive Services / Frequency Ages 35 to 4 years  Blood pressure check.** / Every 3-5 years.  Lipid and cholesterol check.** / Every 5 years beginning at age 60.  Clinical breast exam.** / Every 3 years for women in their 71s and 10s.  BRCA-related cancer risk assessment.** / For women who have family members with a BRCA-related cancer (breast, ovarian, tubal, or peritoneal cancers).  Pap test.** / Every 2 years from ages 76 through 26. Every 3 years starting at age 61 through age 76 or 93 with a history of 3 consecutive normal Pap tests.  HPV screening.** / Every 3 years from ages 37 through ages 60 to 51 with a history of 3 consecutive normal Pap tests.  Hepatitis C blood test.** / For any individual with known risks for hepatitis C.  Skin self-exam. / Monthly.  Influenza vaccine. / Every year.  Tetanus, diphtheria, and acellular pertussis (Tdap, Td) vaccine.** / Consult your health care provider. Pregnant women should receive 1 dose of Tdap vaccine during each pregnancy. 1 dose of Td every 10 years.  Varicella vaccine.** / Consult your health care provider. Pregnant females who do not have evidence of immunity should receive the first dose after pregnancy.  HPV vaccine. / 3 doses over 6 months, if 93 and younger. The vaccine is not recommended for use in pregnant females. However, pregnancy testing is not needed before receiving a dose.  Measles, mumps, rubella (MMR) vaccine.** / You need at least 1 dose of MMR if you were born in 1957 or later. You may also need a 2nd dose. For females of childbearing age, rubella immunity should be determined. If there is no evidence of immunity, females who are not pregnant should be vaccinated. If there is no evidence of immunity, females who are  pregnant should delay immunization until after pregnancy.  Pneumococcal  13-valent conjugate (PCV13) vaccine.** / Consult your health care provider.  Pneumococcal polysaccharide (PPSV23) vaccine.** / 1 to 2 doses if you smoke cigarettes or if you have certain conditions.  Meningococcal vaccine.** / 1 dose if you are age 68 to 8 years and a Market researcher living in a residence hall, or have one of several medical conditions, you need to get vaccinated against meningococcal disease. You may also need additional booster doses.  Hepatitis A vaccine.** / Consult your health care provider.  Hepatitis B vaccine.** / Consult your health care provider.  Haemophilus influenzae type b (Hib) vaccine.** / Consult your health care provider. Ages 7 to 53 years  Blood pressure check.** / Every year.  Lipid and cholesterol check.** / Every 5 years beginning at age 25 years.  Lung cancer screening. / Every year if you are aged 11-80 years and have a 30-pack-year history of smoking and currently smoke or have quit within the past 15 years. Yearly screening is stopped once you have quit smoking for at least 15 years or develop a health problem that would prevent you from having lung cancer treatment.  Clinical breast exam.** / Every year after age 48 years.  BRCA-related cancer risk assessment.** / For women who have family members with a BRCA-related cancer (breast, ovarian, tubal, or peritoneal cancers).  Mammogram.** / Every year beginning at age 41 years and continuing for as long as you are in good health. Consult with your health care provider.  Pap test.** / Every 3 years starting at age 65 years through age 37 or 70 years with a history of 3 consecutive normal Pap tests.  HPV screening.** / Every 3 years from ages 72 years through ages 60 to 40 years with a history of 3 consecutive normal Pap tests.  Fecal occult blood test (FOBT) of stool. / Every year beginning at age 21 years and continuing until age 5 years. You may not need to do this test if you get  a colonoscopy every 10 years.  Flexible sigmoidoscopy or colonoscopy.** / Every 5 years for a flexible sigmoidoscopy or every 10 years for a colonoscopy beginning at age 35 years and continuing until age 48 years.  Hepatitis C blood test.** / For all people born from [birth year] through 1965 and any individual with known risks for hepatitis C.  Skin self-exam. / Monthly.  Influenza vaccine. / Every year.  Tetanus, diphtheria, and acellular pertussis (Tdap/Td) vaccine.** / Consult your health care provider. Pregnant women should receive 1 dose of Tdap vaccine during each pregnancy. 1 dose of Td every 10 years.  Varicella vaccine.** / Consult your health care provider. Pregnant females who do not have evidence of immunity should receive the first dose after pregnancy.  Zoster vaccine.** / 1 dose for adults aged 30 years or older.  Measles, mumps, rubella (MMR) vaccine.** / You need at least 1 dose of MMR if you were born in 1957 or later. You may also need a second dose. For females of childbearing age, rubella immunity should be determined. If there is no evidence of immunity, females who are not pregnant should be vaccinated. If there is no evidence of immunity, females who are pregnant should delay immunization until after pregnancy.  Pneumococcal 13-valent conjugate (PCV13) vaccine.** / Consult your health care provider.  Pneumococcal polysaccharide (PPSV23) vaccine.** / 1 to 2 doses if you smoke cigarettes or if you have certain conditions.  Meningococcal vaccine.** /  Consult your health care provider.  Hepatitis A vaccine.** / Consult your health care provider.  Hepatitis B vaccine.** / Consult your health care provider.  Haemophilus influenzae type b (Hib) vaccine.** / Consult your health care provider. Ages 64 years and over  Blood pressure check.** / Every year.  Lipid and cholesterol check.** / Every 5 years beginning at age 23 years.  Lung cancer screening. / Every year if you  are aged 16-80 years and have a 30-pack-year history of smoking and currently smoke or have quit within the past 15 years. Yearly screening is stopped once you have quit smoking for at least 15 years or develop a health problem that would prevent you from having lung cancer treatment.  Clinical breast exam.** / Every year after age 74 years.  BRCA-related cancer risk assessment.** / For women who have family members with a BRCA-related cancer (breast, ovarian, tubal, or peritoneal cancers).  Mammogram.** / Every year beginning at age 44 years and continuing for as long as you are in good health. Consult with your health care provider.  Pap test.** / Every 3 years starting at age 58 years through age 22 or 39 years with 3 consecutive normal Pap tests. Testing can be stopped between 65 and 70 years with 3 consecutive normal Pap tests and no abnormal Pap or HPV tests in the past 10 years.  HPV screening.** / Every 3 years from ages 64 years through ages 70 or 61 years with a history of 3 consecutive normal Pap tests. Testing can be stopped between 65 and 70 years with 3 consecutive normal Pap tests and no abnormal Pap or HPV tests in the past 10 years.  Fecal occult blood test (FOBT) of stool. / Every year beginning at age 40 years and continuing until age 27 years. You may not need to do this test if you get a colonoscopy every 10 years.  Flexible sigmoidoscopy or colonoscopy.** / Every 5 years for a flexible sigmoidoscopy or every 10 years for a colonoscopy beginning at age 7 years and continuing until age 32 years.  Hepatitis C blood test.** / For all people born from [birth year] through 1965 and any individual with known risks for hepatitis C.  Osteoporosis screening.** / A one-time screening for women ages 30 years and over and women at risk for fractures or osteoporosis.  Skin self-exam. / Monthly.  Influenza vaccine. / Every year.  Tetanus, diphtheria, and acellular pertussis (Tdap/Td)  vaccine.** / 1 dose of Td every 10 years.  Varicella vaccine.** / Consult your health care provider.  Zoster vaccine.** / 1 dose for adults aged 35 years or older.  Pneumococcal 13-valent conjugate (PCV13) vaccine.** / Consult your health care provider.  Pneumococcal polysaccharide (PPSV23) vaccine.** / 1 dose for all adults aged 46 years and older.  Meningococcal vaccine.** / Consult your health care provider.  Hepatitis A vaccine.** / Consult your health care provider.  Hepatitis B vaccine.** / Consult your health care provider.  Haemophilus influenzae type b (Hib) vaccine.** / Consult your health care provider. ** Family history and personal history of risk and conditions may change your health care provider's recommendations.   This information is not intended to replace advice given to you by your health care provider. Make sure you discuss any questions you have with your health care provider.   Document Released: 06/20/2001 Document Revised: 05/15/2014 Document Reviewed: 09/19/2010 Elsevier Interactive Patient Education [employer].

## 2015-10-08 NOTE — Progress Notes (Signed)
Pre visit review using our clinic review tool, if applicable. No additional management support is needed unless otherwise documented below in the visit note. 

## 2015-10-08 NOTE — Progress Notes (Signed)
Subjective:     Judith [name] is a 60 y.o. female and is here for a comprehensive physical exam. The patient reports she is struggling with weight loss and is asking about meds .  `  Social History   Social History  . Marital Status: Married    Spouse Name: N/A  . Number of Children: N/A  . Years of Education: N/A   Occupational History  . [employer]     accountant Soil scientist   Social History Main Topics  . Smoking status: Former Smoker -- 0.20 packs/day for 5 years    Types: Cigarettes    Quit date: 09/09/1986  . Smokeless tobacco: Never Used  . Alcohol Use: 8.4 oz/week    14 Glasses of wine per week  . Drug Use: No  . Sexual Activity:    Partners: Male   Other Topics Concern  . Not on file   Social History Narrative   Exercise--walk with dog   Health Maintenance  Topic Date Due  . Hepatitis C Screening  1955-06-19  . HIV Screening  02/12/1973  . MAMMOGRAM  05/24/2015  . INFLUENZA VACCINE  12/07/2015  . PAP SMEAR  03/03/2016  . COLONOSCOPY  02/17/2020  . TETANUS/TDAP  09/08/2021    The following portions of the patient's history were reviewed and updated as appropriate:  She  has no past medical history on file. She  does not have any pertinent problems on file. She  has no past surgical history on file. Her family history includes Arthritis in her father; Cancer in her father; Cancer (age of onset: 54) in her mother. She  reports that she quit smoking about 29 years ago. Her smoking use included Cigarettes. She has a 1 pack-year smoking history. She has never used smokeless tobacco. She reports that she drinks about 8.4 oz of alcohol per week. She reports that she does not use illicit drugs. She has a current medication list which includes the following prescription(s): calcium citrate-vitamin d and vitamin c. Current Outpatient Prescriptions on File Prior to Visit  Medication Sig Dispense Refill  . Calcium Carbonate-Vitamin D  (CALCIUM + D PO) Take 1 tablet by mouth daily.    . vitamin C (ASCORBIC ACID) 500 MG tablet Take 500 mg by mouth daily.     No current facility-administered medications on file prior to visit.   She has No Known Allergies..  Review of Systems Review of Systems  Constitutional: Negative for activity change, appetite change and fatigue.  HENT: Negative for hearing loss, congestion, tinnitus and ear discharge.  dentist q3m Eyes: Negative for visual disturbance (see optho q1y -- vision corrected to 20/20 with glasses).  Respiratory: Negative for cough, chest tightness and shortness of breath.   Cardiovascular: Negative for chest pain, palpitations and leg swelling.  Gastrointestinal: Negative for abdominal pain, diarrhea, constipation and abdominal distention.  Genitourinary: Negative for urgency, frequency, decreased urine volume and difficulty urinating.  Musculoskeletal: Negative for back pain, arthralgias and gait problem.  Skin: Negative for color change, pallor and rash.  Neurological: Negative for dizziness, light-headedness, numbness and headaches.  Hematological: Negative for adenopathy. Does not bruise/bleed easily.  Psychiatric/Behavioral: Negative for suicidal ideas, confusion, sleep disturbance, self-injury, dysphoric mood, decreased concentration and agitation.       Objective:    BP 126/80 mmHg  Pulse 68  Temp(Src) 97.9 F (36.6 C) (Oral)  Ht 5\' 4"  (1.626 m)  Wt 244 lb (110.678 kg)  BMI 41.86 kg/m2  SpO2 98%  General appearance: alert, cooperative, appears stated age and no distress Head: Normocephalic, without obvious abnormality, atraumatic Eyes: conjunctivae/corneas clear. PERRL, EOM's intact. Fundi benign. Ears: normal TM's and external ear canals both ears Nose: Nares normal. Septum midline. Mucosa normal. No drainage or sinus tenderness. Throat: lips, mucosa, and tongue normal; teeth and gums normal Neck: no adenopathy, supple, symmetrical, trachea midline  and thyroid not enlarged, symmetric, no tenderness/mass/nodules Back: symmetric, no curvature. ROM normal. No CVA tenderness. Lungs: clear to auscultation bilaterally Breasts: normal appearance, no masses or tenderness Heart: regular rate and rhythm, S1, S2 normal, no murmur, click, rub or gallop Abdomen: soft, non-tender; bowel sounds normal; no masses,  no organomegaly Pelvic: cervix normal in appearance, external genitalia normal, no adnexal masses or tenderness, no cervical motion tenderness, rectovaginal septum normal, uterus normal size, shape, and consistency, vagina normal without discharge and pap is done Extremities: extremities normal, atraumatic, no cyanosis or edema Pulses: 2+ and symmetric Skin: Skin color, texture, turgor normal. No rashes or lesions Lymph nodes: Cervical, supraclavicular, and axillary nodes normal. Neurologic: Alert and oriented X 3, normal strength and tone. Normal symmetric reflexes. Normal coordination and gait    Assessment:    Healthy female exam.      Plan:    ghm utd Check labs See After Visit Summary for Counseling Recommendations   1. Encounter for screening mammogram for malignant neoplasm of breast  - MM Digital Screening; Future  2. Preventative health care  - Comprehensive metabolic panel - Lipid panel - CBC with Differential/Platelet - POCT urinalysis dipstick - TSH  3. Need for hepatitis C screening test  - Hepatitis C antibody  4. Abnormal urine  - Urine Culture  5. Cervical cancer screening  - Cytology - PAP

## 2015-10-09 LAB — URINE CULTURE

## 2015-10-11 LAB — CYTOLOGY - PAP

## 2015-10-26 ENCOUNTER — Ambulatory Visit
Admission: RE | Admit: 2015-10-26 | Discharge: 2015-10-26 | Disposition: A | Discharge status: Home / Self Care | Payer: BLUE CROSS/BLUE SHIELD | Source: Ambulatory Visit | Attending: Family Medicine | Admitting: Family Medicine

## 2015-10-26 DIAGNOSIS — Z1231 Encounter for screening mammogram for malignant neoplasm of breast: Secondary | ICD-10-CM

## 2016-10-10 ENCOUNTER — Ambulatory Visit (INDEPENDENT_AMBULATORY_CARE_PROVIDER_SITE_OTHER): Payer: BLUE CROSS/BLUE SHIELD | Admitting: Family Medicine

## 2016-10-10 ENCOUNTER — Encounter: Payer: Self-pay | Admitting: Family Medicine

## 2016-10-10 VITALS — BP 132/76 | HR 84 | Temp 97.8°F | Resp 16 | Ht 64.6 in | Wt 246.2 lb

## 2016-10-10 DIAGNOSIS — Z0000 Encounter for general adult medical examination without abnormal findings: Secondary | ICD-10-CM | POA: Diagnosis not present

## 2016-10-10 DIAGNOSIS — R8299 Other abnormal findings in urine: Secondary | ICD-10-CM | POA: Diagnosis not present

## 2016-10-10 DIAGNOSIS — Z1239 Encounter for other screening for malignant neoplasm of breast: Secondary | ICD-10-CM

## 2016-10-10 DIAGNOSIS — Z1231 Encounter for screening mammogram for malignant neoplasm of breast: Secondary | ICD-10-CM

## 2016-10-10 DIAGNOSIS — E2839 Other primary ovarian failure: Secondary | ICD-10-CM

## 2016-10-10 DIAGNOSIS — R82998 Other abnormal findings in urine: Secondary | ICD-10-CM

## 2016-10-10 LAB — LIPID PANEL
CHOL/HDL RATIO: 4
Cholesterol: 191 mg/dL (ref 0–200)
HDL: 44 mg/dL (ref >39.00)
LDL CALC: 132 mg/dL — AB (ref 0–99)
NONHDL: 146.68
TRIGLYCERIDES: 75 mg/dL (ref 0.0–149.0)
VLDL: 15 mg/dL (ref 0.0–40.0)

## 2016-10-10 LAB — COMPREHENSIVE METABOLIC PANEL
ALT: 26 U/L (ref 0–35)
AST: 21 U/L (ref 0–37)
Albumin: 4.1 g/dL (ref 3.5–5.2)
Alkaline Phosphatase: 94 U/L (ref 39–117)
BILIRUBIN TOTAL: 0.4 mg/dL (ref 0.2–1.2)
BUN: 17 mg/dL (ref 6–23)
CHLORIDE: 108 meq/L (ref 96–112)
CO2: 25 meq/L (ref 19–32)
Calcium: 9.3 mg/dL (ref 8.4–10.5)
Creatinine, Ser: 0.68 mg/dL (ref 0.40–1.20)
GFR: 94.24 mL/min (ref >60.00)
GLUCOSE: 101 mg/dL — AB (ref 70–99)
Potassium: 3.9 mEq/L (ref 3.5–5.1)
Sodium: 140 mEq/L (ref 135–145)
Total Protein: 6.9 g/dL (ref 6.0–8.3)

## 2016-10-10 LAB — POC URINALSYSI DIPSTICK (AUTOMATED)
BILIRUBIN UA: NEGATIVE
Glucose, UA: NEGATIVE
KETONES UA: NEGATIVE
NITRITE UA: NEGATIVE
PH UA: 5.5 (ref 5.0–8.0)
Protein, UA: NEGATIVE
RBC UA: NEGATIVE
Urobilinogen, UA: 0.2 E.U./dL

## 2016-10-10 LAB — TSH: TSH: 2.09 u[IU]/mL (ref 0.35–4.50)

## 2016-10-10 LAB — CBC WITH DIFFERENTIAL/PLATELET
BASOS PCT: 0.5 % (ref 0.0–3.0)
Basophils Absolute: 0 10*3/uL (ref 0.0–0.1)
EOS ABS: 0.3 10*3/uL (ref 0.0–0.7)
Eosinophils Relative: 4.1 % (ref 0.0–5.0)
HCT: 43.4 % (ref 36.0–46.0)
Hemoglobin: 14.6 g/dL (ref 12.0–15.0)
LYMPHS ABS: 2.2 10*3/uL (ref 0.7–4.0)
Lymphocytes Relative: 31.7 % (ref 12.0–46.0)
MCHC: 33.6 g/dL (ref 30.0–36.0)
MCV: 91.4 fl (ref 78.0–100.0)
MONO ABS: 0.4 10*3/uL (ref 0.1–1.0)
Monocytes Relative: 6.2 % (ref 3.0–12.0)
NEUTROS ABS: 4 10*3/uL (ref 1.4–7.7)
NEUTROS PCT: 57.5 % (ref 43.0–77.0)
PLATELETS: 348 10*3/uL (ref 150.0–400.0)
RBC: 4.75 Mil/uL (ref 3.87–5.11)
RDW: 13.5 % (ref 11.5–15.5)
WBC: 7 10*3/uL (ref 4.0–10.5)

## 2016-10-10 NOTE — Patient Instructions (Signed)
Preventive Care 40-64 Years, Female Preventive care refers to lifestyle choices and visits with your health care provider that can promote health and wellness. What does preventive care include?  A yearly physical exam. This is also called an annual well check.  Dental exams once or twice a year.  Routine eye exams. Ask your health care provider how often you should have your eyes checked.  Personal lifestyle choices, including: ? Daily care of your teeth and gums. ? Regular physical activity. ? Eating a healthy diet. ? Avoiding tobacco and drug use. ? Limiting alcohol use. ? Practicing safe sex. ? Taking low-dose aspirin daily starting at age 58. ? Taking vitamin and mineral supplements as recommended by your health care provider. What happens during an annual well check? The services and screenings done by your health care provider during your annual well check will depend on your age, overall health, lifestyle risk factors, and family history of disease. Counseling Your health care provider may ask you questions about your:  Alcohol use.  Tobacco use.  Drug use.  Emotional well-being.  Home and relationship well-being.  Sexual activity.  Eating habits.  Work and work Statistician.  Method of birth control.  Menstrual cycle.  Pregnancy history.  Screening You may have the following tests or measurements:  Height, weight, and BMI.  Blood pressure.  Lipid and cholesterol levels. These may be checked every 5 years, or more frequently if you are over 81 years old.  Skin check.  Lung cancer screening. You may have this screening every year starting at age 78 if you have a 30-pack-year history of smoking and currently smoke or have quit within the past 15 years.  Fecal occult blood test (FOBT) of the stool. You may have this test every year starting at age 65.  Flexible sigmoidoscopy or colonoscopy. You may have a sigmoidoscopy every 5 years or a colonoscopy  every 10 years starting at age 30.  Hepatitis C blood test.  Hepatitis B blood test.  Sexually transmitted disease (STD) testing.  Diabetes screening. This is done by checking your blood sugar (glucose) after you have not eaten for a while (fasting). You may have this done every 1-3 years.  Mammogram. This may be done every 1-2 years. Talk to your health care provider about when you should start having regular mammograms. This may depend on whether you have a family history of breast cancer.  BRCA-related cancer screening. This may be done if you have a family history of breast, ovarian, tubal, or peritoneal cancers.  Pelvic exam and Pap test. This may be done every 3 years starting at age 80. Starting at age 36, this may be done every 5 years if you have a Pap test in combination with an HPV test.  Bone density scan. This is done to screen for osteoporosis. You may have this scan if you are at high risk for osteoporosis.  Discuss your test results, treatment options, and if necessary, the need for more tests with your health care provider. Vaccines Your health care provider may recommend certain vaccines, such as:  Influenza vaccine. This is recommended every year.  Tetanus, diphtheria, and acellular pertussis (Tdap, Td) vaccine. You may need a Td booster every 10 years.  Varicella vaccine. You may need this if you have not been vaccinated.  Zoster vaccine. You may need this after age 5.  Measles, mumps, and rubella (MMR) vaccine. You may need at least one dose of MMR if you were born in  1957 or later. You may also need a second dose.  Pneumococcal 13-valent conjugate (PCV13) vaccine. You may need this if you have certain conditions and were not previously vaccinated.  Pneumococcal polysaccharide (PPSV23) vaccine. You may need one or two doses if you smoke cigarettes or if you have certain conditions.  Meningococcal vaccine. You may need this if you have certain  conditions.  Hepatitis A vaccine. You may need this if you have certain conditions or if you travel or work in places where you may be exposed to hepatitis A.  Hepatitis B vaccine. You may need this if you have certain conditions or if you travel or work in places where you may be exposed to hepatitis B.  Haemophilus influenzae type b (Hib) vaccine. You may need this if you have certain conditions.  Talk to your health care provider about which screenings and vaccines you need and how often you need them. This information is not intended to replace advice given to you by your health care provider. Make sure you discuss any questions you have with your health care provider. Document Released: 05/21/2015 Document Revised: 01/12/2016 Document Reviewed: 02/23/2015 Elsevier Interactive Patient Education  2017 [employer].

## 2016-10-10 NOTE — Progress Notes (Signed)
Subjective:   I acted as a Education administrator for Dr. Carollee Herter.  Guerry Bruin, CMA   Judith [name] is a 61 y.o. female and is here for a comprehensive physical exam. The patient reports no problems.  Social History   Social History  . Marital status: Married    Spouse name: N/A  . Number of children: N/A  . Years of education: N/A   Occupational History  . [employer]    Social History Main Topics  . Smoking status: Former Smoker    Packs/day: 0.20    Years: 5.00    Types: Cigarettes    Quit date: 09/09/1986  . Smokeless tobacco: Never Used  . Alcohol use 8.4 oz/week    14 Glasses of wine per week  . Drug use: No  . Sexual activity: Yes    Partners: Male   Other Topics Concern  . Not on file   Social History Narrative   Exercise--walk with dog   Health Maintenance  Topic Date Due  . HIV Screening  10/11/2027 (Originally 02/12/1973)  . MAMMOGRAM  10/25/2016  . INFLUENZA VACCINE  12/06/2016  . PAP SMEAR  10/08/2018  . COLONOSCOPY  02/17/2020  . TETANUS/TDAP  09/08/2021  . Hepatitis C Screening  Completed    The following portions of the patient's history were reviewed and updated as appropriate:  She  has no past medical history on file. She  does not have any pertinent problems on file. She  has no past surgical history on file. Her family history includes Arthritis in her father; Cancer in her father; Cancer (age of onset: 38) in her mother. She  reports that she quit smoking about 30 years ago. Her smoking use included Cigarettes. She has a 1.00 pack-year smoking history. She has never used smokeless tobacco. She reports that she drinks about 8.4 oz of alcohol per week . She reports that she does not use drugs. She has a current medication list which includes the following prescription(s): calcium citrate-vitamin d, fish oil, and vitamin c. Current Outpatient Prescriptions on File Prior to Visit  Medication Sig Dispense Refill  . Calcium  Carbonate-Vitamin D (CALCIUM + D PO) Take 1 tablet by mouth daily.    . vitamin C (ASCORBIC ACID) 500 MG tablet Take 500 mg by mouth daily.     No current facility-administered medications on file prior to visit.    She has No Known Allergies..  Review of Systems Review of Systems  Constitutional: Negative for activity change, appetite change and fatigue.  HENT: Negative for hearing loss, congestion, tinnitus and ear discharge.  dentist q33m Eyes: Negative for visual disturbance (see optho q1y -- vision corrected to 20/20 with glasses).  Respiratory: Negative for cough, chest tightness and shortness of breath.   Cardiovascular: Negative for chest pain, palpitations and leg swelling.  Gastrointestinal: Negative for abdominal pain, diarrhea, constipation and abdominal distention.  Genitourinary: Negative for urgency, frequency, decreased urine volume and difficulty urinating.  Musculoskeletal: Negative for back pain, arthralgias and gait problem.  Skin: Negative for color change, pallor and rash.  Neurological: Negative for dizziness, light-headedness, numbness and headaches.  Hematological: Negative for adenopathy. Does not bruise/bleed easily.  Psychiatric/Behavioral: Negative for suicidal ideas, confusion, sleep disturbance, self-injury, dysphoric mood, decreased concentration and agitation.       Objective:    BP 132/76 (BP Location: Left Arm, Cuff Size: Large)   Pulse 84   Temp 97.8 F (36.6 C) (Oral)   Resp 16   Ht 5' 4.6" (  1.641 m)   Wt 246 lb 3.2 oz (111.7 kg)   SpO2 97%   BMI 41.48 kg/m  General appearance: alert, cooperative, appears stated age and no distress Head: Normocephalic, without obvious abnormality, atraumatic Eyes: conjunctivae/corneas clear. PERRL, EOM's intact. Fundi benign. Ears: normal TM's and external ear canals both ears Nose: Nares normal. Septum midline. Mucosa normal. No drainage or sinus tenderness. Throat: lips, mucosa, and tongue normal; teeth  and gums normal Neck: no adenopathy, no carotid bruit, no JVD, supple, symmetrical, trachea midline and thyroid not enlarged, symmetric, no tenderness/mass/nodules Back: symmetric, no curvature. ROM normal. No CVA tenderness. Lungs: clear to auscultation bilaterally Breasts: normal appearance, no masses or tenderness Heart: regular rate and rhythm, S1, S2 normal, no murmur, click, rub or gallop Abdomen: soft, non-tender; bowel sounds normal; no masses,  no organomegaly Pelvic: deferred Extremities: extremities normal, atraumatic, no cyanosis or edema Pulses: 2+ and symmetric Skin: Skin color, texture, turgor normal. No rashes or lesions Lymph nodes: Cervical, supraclavicular, and axillary nodes normal. Neurologic: Alert and oriented X 3, normal strength and tone. Normal symmetric reflexes. Normal coordination and gait    Assessment:    Healthy female exam.      Plan:    ghm utd Check labs  See After Visit Summary for Counseling Recommendations    1. Preventative health care See above - POCT Urinalysis Dipstick (Automated) - CBC with Differential/Platelet - Comprehensive metabolic panel - Lipid panel - TSH  2. Estrogen deficiency  - DG Bone Density; Future  3. Breast cancer screening   - MM DIAG BREAST TOMO BILATERAL; Future  4. Leukocytes in urine    - Urine culture

## 2016-10-11 LAB — URINE CULTURE: Organism ID, Bacteria: NO GROWTH

## 2016-10-12 ENCOUNTER — Other Ambulatory Visit: Payer: Self-pay | Admitting: Family Medicine

## 2016-10-12 DIAGNOSIS — E785 Hyperlipidemia, unspecified: Secondary | ICD-10-CM

## 2016-10-12 DIAGNOSIS — R739 Hyperglycemia, unspecified: Secondary | ICD-10-CM

## 2016-11-03 ENCOUNTER — Ambulatory Visit
Admission: RE | Admit: 2016-11-03 | Discharge: 2016-11-03 | Disposition: A | Discharge status: Home / Self Care | Payer: BLUE CROSS/BLUE SHIELD | Source: Ambulatory Visit | Attending: Family Medicine | Admitting: Family Medicine

## 2016-11-03 DIAGNOSIS — Z1239 Encounter for other screening for malignant neoplasm of breast: Secondary | ICD-10-CM

## 2016-11-03 DIAGNOSIS — E2839 Other primary ovarian failure: Secondary | ICD-10-CM

## 2016-11-06 ENCOUNTER — Other Ambulatory Visit: Payer: Self-pay | Admitting: Family Medicine

## 2016-11-06 DIAGNOSIS — R928 Other abnormal and inconclusive findings on diagnostic imaging of breast: Secondary | ICD-10-CM

## 2016-11-14 ENCOUNTER — Other Ambulatory Visit: Payer: Self-pay | Admitting: Family Medicine

## 2016-11-14 ENCOUNTER — Ambulatory Visit
Admission: RE | Admit: 2016-11-14 | Discharge: 2016-11-14 | Disposition: A | Discharge status: Home / Self Care | Payer: 59 | Source: Ambulatory Visit | Attending: Family Medicine | Admitting: Family Medicine

## 2016-11-14 DIAGNOSIS — R928 Other abnormal and inconclusive findings on diagnostic imaging of breast: Secondary | ICD-10-CM

## 2016-11-14 DIAGNOSIS — R921 Mammographic calcification found on diagnostic imaging of breast: Secondary | ICD-10-CM

## 2016-11-17 ENCOUNTER — Ambulatory Visit
Admission: RE | Admit: 2016-11-17 | Discharge: 2016-11-17 | Disposition: A | Discharge status: Home / Self Care | Payer: 59 | Source: Ambulatory Visit | Attending: Family Medicine | Admitting: Family Medicine

## 2016-11-17 DIAGNOSIS — R921 Mammographic calcification found on diagnostic imaging of breast: Secondary | ICD-10-CM

## 2017-01-18 ENCOUNTER — Other Ambulatory Visit (INDEPENDENT_AMBULATORY_CARE_PROVIDER_SITE_OTHER): Payer: 59

## 2017-01-18 DIAGNOSIS — E785 Hyperlipidemia, unspecified: Secondary | ICD-10-CM

## 2017-01-18 DIAGNOSIS — R739 Hyperglycemia, unspecified: Secondary | ICD-10-CM | POA: Diagnosis not present

## 2017-01-18 LAB — COMPREHENSIVE METABOLIC PANEL
ALT: 18 U/L (ref 0–35)
AST: 15 U/L (ref 0–37)
Albumin: 4 g/dL (ref 3.5–5.2)
Alkaline Phosphatase: 90 U/L (ref 39–117)
BUN: 14 mg/dL (ref 6–23)
CHLORIDE: 109 meq/L (ref 96–112)
CO2: 27 meq/L (ref 19–32)
Calcium: 9.3 mg/dL (ref 8.4–10.5)
Creatinine, Ser: 0.78 mg/dL (ref 0.40–1.20)
GFR: 80.36 mL/min (ref >60.00)
GLUCOSE: 100 mg/dL — AB (ref 70–99)
POTASSIUM: 4.4 meq/L (ref 3.5–5.1)
SODIUM: 143 meq/L (ref 135–145)
Total Bilirubin: 0.3 mg/dL (ref 0.2–1.2)
Total Protein: 6.7 g/dL (ref 6.0–8.3)

## 2017-01-18 LAB — HEMOGLOBIN A1C: Hgb A1c MFr Bld: 5.2 % (ref 4.6–6.5)

## 2017-01-18 LAB — LIPID PANEL
Cholesterol: 184 mg/dL (ref 0–200)
HDL: 51.6 mg/dL (ref >39.00)
LDL Cholesterol: 106 mg/dL — ABNORMAL HIGH (ref 0–99)
NONHDL: 131.99
Total CHOL/HDL Ratio: 4
Triglycerides: 130 mg/dL (ref 0.0–149.0)
VLDL: 26 mg/dL (ref 0.0–40.0)

## 2017-10-12 ENCOUNTER — Ambulatory Visit (INDEPENDENT_AMBULATORY_CARE_PROVIDER_SITE_OTHER): Payer: BLUE CROSS/BLUE SHIELD | Admitting: Family Medicine

## 2017-10-12 ENCOUNTER — Encounter: Payer: Self-pay | Admitting: Family Medicine

## 2017-10-12 VITALS — BP 134/82 | HR 72 | Temp 98.5°F | Resp 16 | Ht 64.6 in | Wt 250.4 lb

## 2017-10-12 DIAGNOSIS — Z0000 Encounter for general adult medical examination without abnormal findings: Secondary | ICD-10-CM

## 2017-10-12 DIAGNOSIS — Z6841 Body Mass Index (BMI) 40.0 and over, adult: Secondary | ICD-10-CM | POA: Diagnosis not present

## 2017-10-12 DIAGNOSIS — E049 Nontoxic goiter, unspecified: Secondary | ICD-10-CM

## 2017-10-12 DIAGNOSIS — R739 Hyperglycemia, unspecified: Secondary | ICD-10-CM | POA: Diagnosis not present

## 2017-10-12 LAB — CBC WITH DIFFERENTIAL/PLATELET
BASOS ABS: 0 10*3/uL (ref 0.0–0.1)
Basophils Relative: 0.6 % (ref 0.0–3.0)
EOS PCT: 3.5 % (ref 0.0–5.0)
Eosinophils Absolute: 0.2 10*3/uL (ref 0.0–0.7)
HEMATOCRIT: 45.7 % (ref 36.0–46.0)
HEMOGLOBIN: 15.3 g/dL — AB (ref 12.0–15.0)
LYMPHS PCT: 33.3 % (ref 12.0–46.0)
Lymphs Abs: 2.2 10*3/uL (ref 0.7–4.0)
MCHC: 33.5 g/dL (ref 30.0–36.0)
MCV: 92.6 fl (ref 78.0–100.0)
MONOS PCT: 6.6 % (ref 3.0–12.0)
Monocytes Absolute: 0.4 10*3/uL (ref 0.1–1.0)
NEUTROS PCT: 56 % (ref 43.0–77.0)
Neutro Abs: 3.8 10*3/uL (ref 1.4–7.7)
Platelets: 363 10*3/uL (ref 150.0–400.0)
RBC: 4.94 Mil/uL (ref 3.87–5.11)
RDW: 13.6 % (ref 11.5–15.5)
WBC: 6.7 10*3/uL (ref 4.0–10.5)

## 2017-10-12 LAB — LIPID PANEL
Cholesterol: 188 mg/dL (ref 0–200)
HDL: 46 mg/dL (ref >39.00)
LDL CALC: 118 mg/dL — AB (ref 0–99)
NONHDL: 141.96
Total CHOL/HDL Ratio: 4
Triglycerides: 118 mg/dL (ref 0.0–149.0)
VLDL: 23.6 mg/dL (ref 0.0–40.0)

## 2017-10-12 LAB — COMPREHENSIVE METABOLIC PANEL
ALBUMIN: 4.1 g/dL (ref 3.5–5.2)
ALK PHOS: 103 U/L (ref 39–117)
ALT: 23 U/L (ref 0–35)
AST: 18 U/L (ref 0–37)
BILIRUBIN TOTAL: 0.4 mg/dL (ref 0.2–1.2)
BUN: 12 mg/dL (ref 6–23)
CALCIUM: 9.7 mg/dL (ref 8.4–10.5)
CO2: 28 mEq/L (ref 19–32)
Chloride: 106 mEq/L (ref 96–112)
Creatinine, Ser: 0.72 mg/dL (ref 0.40–1.20)
GFR: 87.92 mL/min (ref >60.00)
GLUCOSE: 93 mg/dL (ref 70–99)
Potassium: 4.8 mEq/L (ref 3.5–5.1)
Sodium: 142 mEq/L (ref 135–145)
TOTAL PROTEIN: 6.9 g/dL (ref 6.0–8.3)

## 2017-10-12 LAB — HEMOGLOBIN A1C: HEMOGLOBIN A1C: 5.2 % (ref 4.6–6.5)

## 2017-10-12 NOTE — Progress Notes (Signed)
Subjective:     Judith [name] is a 62 y.o. female and is here for a comprehensive physical exam. The patient reports no problems.  Social History   Socioeconomic History  . Marital status: Married    Spouse name: Not on file  . Number of children: Not on file  . Years of education: Not on file  . Highest education level: Not on file  Occupational History  . Occupation: client services associates  Social Needs  . Financial resource strain: Not on file  . Food insecurity:    Worry: Not on file    Inability: Not on file  . Transportation needs:    Medical: Not on file    Non-medical: Not on file  Tobacco Use  . Smoking status: Former Smoker    Packs/day: 0.20    Years: 5.00    Pack years: 1.00    Types: Cigarettes    Last attempt to quit: 09/09/1986    Years since quitting: 31.1  . Smokeless tobacco: Never Used  Substance and Sexual Activity  . Alcohol use: Yes    Alcohol/week: 8.4 oz    Types: 14 Glasses of wine per week  . Drug use: No  . Sexual activity: Yes    Partners: Male  Lifestyle  . Physical activity:    Days per week: Not on file    Minutes per session: Not on file  . Stress: Not on file  Relationships  . Social connections:    Talks on phone: Not on file    Gets together: Not on file    Attends religious service: Not on file    Active member of club or organization: Not on file    Attends meetings of clubs or organizations: Not on file    Relationship status: Not on file  . Intimate partner violence:    Fear of current or ex partner: Not on file    Emotionally abused: Not on file    Physically abused: Not on file    Forced sexual activity: Not on file  Other Topics Concern  . Not on file  Social History Narrative   Exercise--walk with dog   Health Maintenance  Topic Date Due  . HIV Screening  10/11/2027 (Originally 02/12/1971)  . MAMMOGRAM  11/14/2017  . INFLUENZA VACCINE  12/06/2017  . PAP SMEAR  10/08/2018  . COLONOSCOPY   02/17/2020  . TETANUS/TDAP  09/08/2021  . Hepatitis C Screening  Completed    The following portions of the patient's history were reviewed and updated as appropriate:  She  has no past medical history on file. She does not have any pertinent problems on file. She  has no past surgical history on file. Her family history includes Arthritis in her father; Cancer in her father; Cancer (age of onset: 66) in her mother. She  reports that she quit smoking about 31 years ago. Her smoking use included cigarettes. She has a 1.00 pack-year smoking history. She has never used smokeless tobacco. She reports that she drinks about 8.4 oz of alcohol per week. She reports that she does not use drugs. She has a current medication list which includes the following prescription(s): calcium-magnesium-vitamin d, fish oil, and vitamin c. Current Outpatient Medications on File Prior to Visit  Medication Sig Dispense Refill  . Calcium-Magnesium-Vitamin D (CALCIUM 1200+D3 PO) Take 1 tablet by mouth daily. 1000U vit d    . Omega-3 Fatty Acids (FISH OIL) 1200 MG CAPS Take 1 capsule by mouth daily.    Marland Kitchen  vitamin C (ASCORBIC ACID) 500 MG tablet Take 500 mg by mouth daily.     No current facility-administered medications on file prior to visit.    She has No Known Allergies..  Review of Systems Review of Systems  Constitutional: Negative for activity change, appetite change and fatigue.  HENT: Negative for hearing loss, congestion, tinnitus and ear discharge.  dentist q56m Eyes: Negative for visual disturbance (see optho q1y -- vision corrected to 20/20 with glasses).  Respiratory: Negative for cough, chest tightness and shortness of breath.   Cardiovascular: Negative for chest pain, palpitations and leg swelling.  Gastrointestinal: Negative for abdominal pain, diarrhea, constipation and abdominal distention.  Genitourinary: Negative for urgency, frequency, decreased urine volume and difficulty urinating.   Musculoskeletal: Negative for back pain, arthralgias and gait problem.  Skin: Negative for color change, pallor and rash.  Neurological: Negative for dizziness, light-headedness, numbness and headaches.  Hematological: Negative for adenopathy. Does not bruise/bleed easily.  Psychiatric/Behavioral: Negative for suicidal ideas, confusion, sleep disturbance, self-injury, dysphoric mood, decreased concentration and agitation.       Objective:    BP 134/82   Pulse 72   Temp 98.5 F (36.9 C) (Oral)   Resp 16   Ht 5' 4.6" (1.641 m)   Wt 250 lb 6.4 oz (113.6 kg)   SpO2 99%   BMI 42.19 kg/m  General appearance: alert, cooperative, appears stated age and no distress Head: Normocephalic, without obvious abnormality, atraumatic Eyes: negative findings: lids and lashes normal and pupils equal, round, reactive to light and accomodation Ears: normal TM's and external ear canals both ears Nose: Nares normal. Septum midline. Mucosa normal. No drainage or sinus tenderness. Throat: lips, mucosa, and tongue normal; teeth and gums normal Neck: no adenopathy, no carotid bruit, no JVD, supple, symmetrical, trachea midline and thyroid: enlarged Back: symmetric, no curvature. ROM normal. No CVA tenderness. Lungs: clear to auscultation bilaterally Breasts: normal appearance, no masses or tenderness Heart: regular rate and rhythm, S1, S2 normal, no murmur, click, rub or gallop Abdomen: soft, non-tender; bowel sounds normal; no masses,  no organomegaly Pelvic: deferred Extremities: extremities normal, atraumatic, no cyanosis or edema Pulses: 2+ and symmetric Skin: Skin color, texture, turgor normal. No rashes or lesions Lymph nodes: Cervical, supraclavicular, and axillary nodes normal. Neurologic: Alert and oriented X 3, normal strength and tone. Normal symmetric reflexes. Normal coordination and gait    Assessment:    Healthy female exam.      Plan:    ghm utd Check labs See After Visit  Summary for Counseling Recommendations    1. Hyperglycemia  - Hemoglobin A1c  2. Preventative health care See above - Hemoglobin A1c - Comprehensive metabolic panel - CBC with Differential/Platelet - Lipid panel  3. Morbid obesity with BMI of 40.0-44.9, adult (HCC)  - Amb Ref to Medical Weight Management  4. Enlarged thyroid  - US THYROID; Future

## 2017-10-12 NOTE — Patient Instructions (Signed)
Preventive Care 40-64 Years, Female Preventive care refers to lifestyle choices and visits with your health care provider that can promote health and wellness. What does preventive care include?  A yearly physical exam. This is also called an annual well check.  Dental exams once or twice a year.  Routine eye exams. Ask your health care provider how often you should have your eyes checked.  Personal lifestyle choices, including: ? Daily care of your teeth and gums. ? Regular physical activity. ? Eating a healthy diet. ? Avoiding tobacco and drug use. ? Limiting alcohol use. ? Practicing safe sex. ? Taking low-dose aspirin daily starting at age 58. ? Taking vitamin and mineral supplements as recommended by your health care provider. What happens during an annual well check? The services and screenings done by your health care provider during your annual well check will depend on your age, overall health, lifestyle risk factors, and family history of disease. Counseling Your health care provider may ask you questions about your:  Alcohol use.  Tobacco use.  Drug use.  Emotional well-being.  Home and relationship well-being.  Sexual activity.  Eating habits.  Work and work Statistician.  Method of birth control.  Menstrual cycle.  Pregnancy history.  Screening You may have the following tests or measurements:  Height, weight, and BMI.  Blood pressure.  Lipid and cholesterol levels. These may be checked every 5 years, or more frequently if you are over 81 years old.  Skin check.  Lung cancer screening. You may have this screening every year starting at age 78 if you have a 30-pack-year history of smoking and currently smoke or have quit within the past 15 years.  Fecal occult blood test (FOBT) of the stool. You may have this test every year starting at age 65.  Flexible sigmoidoscopy or colonoscopy. You may have a sigmoidoscopy every 5 years or a colonoscopy  every 10 years starting at age 30.  Hepatitis C blood test.  Hepatitis B blood test.  Sexually transmitted disease (STD) testing.  Diabetes screening. This is done by checking your blood sugar (glucose) after you have not eaten for a while (fasting). You may have this done every 1-3 years.  Mammogram. This may be done every 1-2 years. Talk to your health care provider about when you should start having regular mammograms. This may depend on whether you have a family history of breast cancer.  BRCA-related cancer screening. This may be done if you have a family history of breast, ovarian, tubal, or peritoneal cancers.  Pelvic exam and Pap test. This may be done every 3 years starting at age 80. Starting at age 36, this may be done every 5 years if you have a Pap test in combination with an HPV test.  Bone density scan. This is done to screen for osteoporosis. You may have this scan if you are at high risk for osteoporosis.  Discuss your test results, treatment options, and if necessary, the need for more tests with your health care provider. Vaccines Your health care provider may recommend certain vaccines, such as:  Influenza vaccine. This is recommended every year.  Tetanus, diphtheria, and acellular pertussis (Tdap, Td) vaccine. You may need a Td booster every 10 years.  Varicella vaccine. You may need this if you have not been vaccinated.  Zoster vaccine. You may need this after age 5.  Measles, mumps, and rubella (MMR) vaccine. You may need at least one dose of MMR if you were born in  1957 or later. You may also need a second dose.  Pneumococcal 13-valent conjugate (PCV13) vaccine. You may need this if you have certain conditions and were not previously vaccinated.  Pneumococcal polysaccharide (PPSV23) vaccine. You may need one or two doses if you smoke cigarettes or if you have certain conditions.  Meningococcal vaccine. You may need this if you have certain  conditions.  Hepatitis A vaccine. You may need this if you have certain conditions or if you travel or work in places where you may be exposed to hepatitis A.  Hepatitis B vaccine. You may need this if you have certain conditions or if you travel or work in places where you may be exposed to hepatitis B.  Haemophilus influenzae type b (Hib) vaccine. You may need this if you have certain conditions.  Talk to your health care provider about which screenings and vaccines you need and how often you need them. This information is not intended to replace advice given to you by your health care provider. Make sure you discuss any questions you have with your health care provider. Document Released: 05/21/2015 Document Revised: 01/12/2016 Document Reviewed: 02/23/2015 Elsevier Interactive Patient Education  2018 Elsevier Inc.  

## 2017-10-15 ENCOUNTER — Telehealth: Payer: Self-pay

## 2017-10-15 NOTE — Telephone Encounter (Signed)
-----   Message from Ann Held, [place] sent at 10/12/2017 12:13 PM EDT ----- I discussed that I thought her thyroid was enlarged but forgot to tell her I wanted to get and US thyroid to look at it closer

## 2017-10-15 NOTE — Telephone Encounter (Signed)
Thyroid U/S scheduled for 6/15 at 1030AM at [facility].

## 2017-10-18 ENCOUNTER — Other Ambulatory Visit: Payer: Self-pay | Admitting: *Deleted

## 2017-10-18 DIAGNOSIS — E785 Hyperlipidemia, unspecified: Secondary | ICD-10-CM

## 2017-10-20 ENCOUNTER — Ambulatory Visit (HOSPITAL_BASED_OUTPATIENT_CLINIC_OR_DEPARTMENT_OTHER)
Admission: RE | Admit: 2017-10-20 | Discharge: 2017-10-20 | Disposition: A | Discharge status: Home / Self Care | Payer: BLUE CROSS/BLUE SHIELD | Source: Ambulatory Visit | Attending: Family Medicine | Admitting: Family Medicine

## 2017-10-20 DIAGNOSIS — E049 Nontoxic goiter, unspecified: Secondary | ICD-10-CM | POA: Insufficient documentation

## 2017-10-20 DIAGNOSIS — Z8639 Personal history of other endocrine, nutritional and metabolic disease: Secondary | ICD-10-CM | POA: Diagnosis not present

## 2017-12-27 ENCOUNTER — Encounter (INDEPENDENT_AMBULATORY_CARE_PROVIDER_SITE_OTHER): Payer: Self-pay

## 2018-01-02 ENCOUNTER — Encounter (INDEPENDENT_AMBULATORY_CARE_PROVIDER_SITE_OTHER): Payer: Self-pay | Admitting: Family Medicine

## 2018-01-09 ENCOUNTER — Ambulatory Visit (INDEPENDENT_AMBULATORY_CARE_PROVIDER_SITE_OTHER): Payer: BLUE CROSS/BLUE SHIELD | Admitting: Family Medicine

## 2018-01-09 ENCOUNTER — Encounter (INDEPENDENT_AMBULATORY_CARE_PROVIDER_SITE_OTHER): Payer: Self-pay | Admitting: Family Medicine

## 2018-01-09 VITALS — BP 131/83 | HR 80 | Temp 98.1°F | Ht 64.0 in | Wt 253.0 lb

## 2018-01-09 DIAGNOSIS — Z6841 Body Mass Index (BMI) 40.0 and over, adult: Secondary | ICD-10-CM

## 2018-01-09 DIAGNOSIS — E7849 Other hyperlipidemia: Secondary | ICD-10-CM

## 2018-01-09 DIAGNOSIS — Z1331 Encounter for screening for depression: Secondary | ICD-10-CM | POA: Diagnosis not present

## 2018-01-09 DIAGNOSIS — Z9189 Other specified personal risk factors, not elsewhere classified: Secondary | ICD-10-CM | POA: Diagnosis not present

## 2018-01-09 DIAGNOSIS — R0602 Shortness of breath: Secondary | ICD-10-CM

## 2018-01-09 DIAGNOSIS — R5383 Other fatigue: Secondary | ICD-10-CM

## 2018-01-09 DIAGNOSIS — Z0289 Encounter for other administrative examinations: Secondary | ICD-10-CM

## 2018-01-09 NOTE — Progress Notes (Signed)
Office: [phone]  /  Fax: [phone]   Dear Dr. Carollee [name],   Thank you for referring Judith [name] to our clinic. The following note includes my evaluation and treatment recommendations.  HPI:   Chief Complaint: OBESITY    Judith [name] has been referred by Judith Held, DO for consultation regarding *HIS* obesity and obesity related comorbidities.    Judith [name] (MR# [id number]) is a 62 y.o. female who presents on 01/09/2018 for obesity evaluation and treatment. Current BMI is Body mass index is 43.43 kg/m.Marland Kitchen Judith [name] has been struggling with her weight for many years and has been unsuccessful in either losing weight, maintaining weight loss, or reaching her healthy weight goal. Judith [name] has a history of diet pills, but she doesn't know what pills those are.     Judith [name] attended our information session and states she is currently in the action stage of change and ready to dedicate time achieving and maintaining a healthier weight. Judith [name] is interested in becoming our patient and working on intensive lifestyle modifications including (but not limited to) diet, exercise and weight loss.    Judith [name] states her family eats meals together she thinks her family will eat healthier with  her her desired weight loss is 95 to 105 lbs she has been heavy most of  her life she started gaining weight over time her heaviest weight ever was 255 lbs. she has significant food cravings issues  she skips meals frequently she is frequently drinking liquids with calories she frequently makes poor food choices she struggles with emotional eating    Fatigue Judith [name] feels her energy is lower than it should be. This has worsened with weight gain and has not worsened recently. Judith [name] admits to daytime somnolence and denies waking up still tired. Patient is at risk for obstructive sleep apnea. Judith [name] has a history of symptoms of daytime fatigue. Patient  generally gets 7 hours of sleep per night, and states they generally have restful sleep. Snoring is present. Apneic episodes are not present. Epworth Sleepiness Score is 4  EKG was ordered today and shows normal sinus rhythm with low voltage.  Dyspnea on exertion Judith [name] notes increasing shortness of breath with exercising and seems to be worsening over time with weight gain. She notes getting out of breath sooner with activity than she used to. This has not gotten worse recently. EKG was ordered today and shows normal sinus rhythm with low voltage. Judith [name] denies orthopnea.  Hyperlipidemia Judith [name] has hyperlipidemia and she has had elevated LDL for years. Judith [name] is on fish oil but she is not on statin. She is attempting to improve her cholesterol levels with intensive lifestyle modification including a low saturated fat diet, exercise and weight loss. She denies any chest pain, claudication or myalgias.  At risk for cardiovascular disease Judith [name] is at a higher than average risk for cardiovascular disease due to obesity and hyperlipidemia. She currently denies any chest pain.  Depression Screen Judith [name] Food and Mood (modified PHQ-9) score was  Depression screen PHQ 2/9 01/09/2018  Decreased Interest 2  Down, Depressed, Hopeless 1  PHQ - 2 Score 3  Altered sleeping 1  Tired, decreased energy 2  Change in appetite 0  Feeling bad or failure about yourself  3  Trouble concentrating 1  Moving slowly or fidgety/restless 1  Suicidal thoughts 0  PHQ-9 Score 11  Difficult doing work/chores Not difficult at all    ALLERGIES: No Known Allergies  MEDICATIONS: Current Outpatient Medications on File Prior to Visit  Medication Sig Dispense Refill  . Calcium-Magnesium-Vitamin D (CALCIUM 1200+D3 PO) Take 1 tablet by mouth daily. 1000U vit d    . Omega-3 Fatty Acids (FISH OIL) 1200 MG CAPS Take 1 capsule by mouth daily.    . vitamin C (ASCORBIC ACID) 500 MG tablet Take 500 mg by mouth daily.       No current facility-administered medications on file prior to visit.     PAST MEDICAL HISTORY: Past Medical History:  Diagnosis Date  . Obesity     PAST SURGICAL HISTORY: History reviewed. No pertinent surgical history.  SOCIAL HISTORY: Social History   Tobacco Use  . Smoking status: Former Smoker    Packs/day: 0.20    Years: 5.00    Pack years: 1.00    Types: Cigarettes    Last attempt to quit: 09/09/1986    Years since quitting: 31.3  . Smokeless tobacco: Never Used  Substance Use Topics  . Alcohol use: Yes    Alcohol/week: 14.0 standard drinks    Types: 14 Glasses of wine per week  . Drug use: No    FAMILY HISTORY: Family History  Problem Relation Age of Onset  . Cancer Mother 23       lymphoma  . Cancer Father        renal 1969, 1999  . Arthritis Father     ROS: Review of Systems  Constitutional: Positive for malaise/fatigue.  Eyes:       Wear Glasses or Contacts Floaters  Respiratory: Positive for shortness of breath (with activity).   Cardiovascular: Negative for chest pain, orthopnea and claudication.  Musculoskeletal: Negative for myalgias.  Skin:       Dryness   Endo/Heme/Allergies: Bruises/bleeds easily.    PHYSICAL EXAM: Blood pressure 131/83, pulse 80, temperature 98.1 F (36.7 C), temperature source Oral, height 5\' 4"  (1.626 m), weight 253 lb (114.8 kg), SpO2 96 %. Body mass index is 43.43 kg/m. Physical Exam  Constitutional: She is oriented to person, place, and time. She appears well-developed and well-nourished.  HENT:  Head: Normocephalic and atraumatic.  Nose: Nose normal.  Eyes: EOM are normal. No scleral icterus.  Neck: Normal range of motion. Neck supple. No thyromegaly present.  Cardiovascular: Normal rate and regular rhythm.  Pulmonary/Chest: Effort normal. No respiratory distress.  Abdominal: Soft. There is no tenderness.  + obesity  Musculoskeletal: Normal range of motion.  Range of Motion normal in all 4 extremities   Neurological: She is alert and oriented to person, place, and time. Coordination normal.  Skin: Skin is warm and dry.  Psychiatric: She has a normal mood and affect. Her behavior is normal.  Vitals reviewed.   RECENT LABS AND TESTS: BMET    Component Value Date/Time   NA 142 10/12/2017 0946   K 4.8 10/12/2017 0946   CL 106 10/12/2017 0946   CO2 28 10/12/2017 0946   GLUCOSE 93 10/12/2017 0946   BUN 12 10/12/2017 0946   CREATININE 0.72 10/12/2017 0946   CALCIUM 9.7 10/12/2017 0946   GFRNONAA 95.00 12/29/2009 0943   Lab Results  Component Value Date   HGBA1C 5.2 10/12/2017   No results found for: INSULIN CBC    Component Value Date/Time   WBC 6.7 10/12/2017 0946   RBC 4.94 10/12/2017 0946   HGB 15.3 (H) 10/12/2017 0946   HCT 45.7 10/12/2017 0946   PLT 363.0 10/12/2017 0946   MCV 92.6 10/12/2017 0946   MCHC 33.5 10/12/2017 0946   RDW 13.6 10/12/2017 0946  LYMPHSABS 2.2 10/12/2017 0946   MONOABS 0.4 10/12/2017 0946   EOSABS 0.2 10/12/2017 0946   BASOSABS 0.0 10/12/2017 0946   Iron/TIBC/Ferritin/ %Sat No results found for: IRON, TIBC, FERRITIN, IRONPCTSAT Lipid Panel     Component Value Date/Time   CHOL 188 10/12/2017 0946   TRIG 118.0 10/12/2017 0946   HDL 46.00 10/12/2017 0946   CHOLHDL 4 10/12/2017 0946   VLDL 23.6 10/12/2017 0946   LDLCALC 118 (H) 10/12/2017 0946   Hepatic Function Panel     Component Value Date/Time   PROT 6.9 10/12/2017 0946   ALBUMIN 4.1 10/12/2017 0946   AST 18 10/12/2017 0946   ALT 23 10/12/2017 0946   ALKPHOS 103 10/12/2017 0946   BILITOT 0.4 10/12/2017 0946   BILIDIR 0.0 03/03/2013 1033      Component Value Date/Time   TSH 2.09 10/10/2016 0919   TSH 1.88 10/08/2015 1100   TSH 1.96 03/03/2013 1033    ECG  shows NSR with a rate of 87 BPM INDIRECT CALORIMETER done today shows a VO2 of 188 and a REE of 1304.  Her calculated basal metabolic rate is 1610 thus her basal metabolic rate is worse than expected.    ASSESSMENT  AND PLAN: Other fatigue - Plan: EKG 12-Lead, Insulin, random, VITAMIN D 25 Hydroxy (Vit-D Deficiency, Fractures), Vitamin B12, Folate, TSH  Shortness of breath on exertion  Other hyperlipidemia - Plan: Lipid Panel With LDL/HDL Ratio  Depression screening  At risk for heart disease  Class 3 severe obesity with serious comorbidity and body mass index (BMI) of 40.0 to 44.9 in adult, unspecified obesity type ([place])  PLAN: Fatigue Jacoba was informed that her fatigue may be related to obesity, depression or many other causes. Labs will be ordered, and in the meanwhile Bresha has agreed to work on diet, exercise and weight loss to help with fatigue. Proper sleep hygiene was discussed including the need for 7-8 hours of quality sleep each night. A sleep study was not ordered based on symptoms and Epworth score. We will order EKG and indirect calorimetry today.  Dyspnea on exertion Enora's shortness of breath appears to be obesity related and exercise induced. She has agreed to work on weight loss and gradually increase exercise to treat her exercise induced shortness of breath. If Keyri follows our instructions and loses weight without improvement of her shortness of breath, we will plan to refer to pulmonology. We will order EKG, indirect calorimetry and labs today. We will monitor this condition regularly. Judith [name] agrees to this plan.  Hyperlipidemia Hailley was informed of the American Heart Association Guidelines emphasizing intensive lifestyle modifications as the first line treatment for hyperlipidemia. We discussed many lifestyle modifications today in depth, and Judith [name] will continue to work on decreasing saturated fats such as fatty red meat, butter and many fried foods. She will also increase vegetables and lean protein in her diet and continue to work on exercise and weight loss efforts. We will order fasting lipid panel today and Clydette will follow up as directed.  Cardiovascular  risk counseling Judith [name] was given extended (15 minutes) coronary artery disease prevention counseling today. She is 62 y.o. female and has risk factors for heart disease including obesity and hyperlipidemia. We discussed intensive lifestyle modifications today with an emphasis on specific weight loss instructions and strategies. Pt was also informed of the importance of increasing exercise and decreasing saturated fats to help prevent heart disease.  Depression Screen Judith [name] had a moderately positive depression screening. Depression is commonly  associated with obesity and often results in emotional eating behaviors. We will monitor this closely and work on CBT to help improve the non-hunger eating patterns. Referral to Psychology may be required if no improvement is seen as she continues in our clinic.  Obesity Judith [name] is currently in the action stage of change and her goal is to continue with weight loss efforts. I recommend Permelia begin the structured treatment plan as follows:  She has agreed to follow the Category 2 plan Judith [name] has been instructed to eventually work up to a goal of 150 minutes of combined cardio and strengthening exercise per week for weight loss and overall health benefits. We discussed the following Behavioral Modification Strategies today: no skipping meals, planning for success, increasing lean protein intake and increasing vegetables   She was informed of the importance of frequent follow up visits to maximize her success with intensive lifestyle modifications for her multiple health conditions. She was informed we would discuss her lab results at her next visit unless there is a critical issue that needs to be addressed sooner. Adalei agreed to keep her next visit at the agreed upon time to discuss these results.    OBESITY BEHAVIORAL INTERVENTION VISIT  Today's visit was # 1   Starting weight: 253 lbs Starting date: 01/09/18 Today's weight : 253 lbs  Today's  date: 01/09/2018 Total lbs lost to date: 0   ASK: We discussed the diagnosis of obesity with Judith Griffins [name] today and Netanya agreed to give [place] permission to discuss obesity behavioral modification therapy today.  ASSESS: Jazilyn has the diagnosis of obesity and her BMI today is 43.41 Yue is in the action stage of change   ADVISE: Betsi was educated on the multiple health risks of obesity as well as the benefit of weight loss to improve her health. She was advised of the need for long term treatment and the importance of lifestyle modifications to improve her current health and to decrease her risk of future health problems.  AGREE: Multiple dietary modification options and treatment options were discussed and  Ela agreed to follow the recommendations documented in the above note.  ARRANGE: Lakara was educated on the importance of frequent visits to treat obesity as outlined per CMS and USPSTF guidelines and agreed to schedule her next follow up appointment today.  I, Doreene Nest, am acting as transcriptionist for Eber Jones, MD  I have reviewed the above documentation for accuracy and completeness, and I agree with the above. - Ilene Qua, MD

## 2018-01-10 LAB — LIPID PANEL WITH LDL/HDL RATIO
CHOLESTEROL TOTAL: 184 mg/dL (ref 100–199)
HDL: 50 mg/dL (ref >39)
LDL Calculated: 119 mg/dL — ABNORMAL HIGH (ref 0–99)
LDl/HDL Ratio: 2.4 ratio (ref 0.0–3.2)
Triglycerides: 76 mg/dL (ref 0–149)
VLDL CHOLESTEROL CAL: 15 mg/dL (ref 5–40)

## 2018-01-10 LAB — VITAMIN D 25 HYDROXY (VIT D DEFICIENCY, FRACTURES): VIT D 25 HYDROXY: 27.4 ng/mL — AB (ref 30.0–100.0)

## 2018-01-10 LAB — FOLATE: FOLATE: 8.5 ng/mL (ref >3.0)

## 2018-01-10 LAB — TSH: TSH: 1.8 u[IU]/mL (ref 0.450–4.500)

## 2018-01-10 LAB — VITAMIN B12: VITAMIN B 12: 516 pg/mL (ref 232–1245)

## 2018-01-10 LAB — INSULIN, RANDOM: INSULIN: 10 u[IU]/mL (ref 2.6–24.9)

## 2018-01-23 ENCOUNTER — Ambulatory Visit (INDEPENDENT_AMBULATORY_CARE_PROVIDER_SITE_OTHER): Payer: BLUE CROSS/BLUE SHIELD | Admitting: Family Medicine

## 2018-01-23 VITALS — BP 130/80 | HR 79 | Temp 98.4°F | Ht 64.0 in | Wt 248.0 lb

## 2018-01-23 DIAGNOSIS — E559 Vitamin D deficiency, unspecified: Secondary | ICD-10-CM

## 2018-01-23 DIAGNOSIS — E8881 Metabolic syndrome: Secondary | ICD-10-CM | POA: Diagnosis not present

## 2018-01-23 DIAGNOSIS — E7849 Other hyperlipidemia: Secondary | ICD-10-CM | POA: Diagnosis not present

## 2018-01-23 DIAGNOSIS — Z9189 Other specified personal risk factors, not elsewhere classified: Secondary | ICD-10-CM

## 2018-01-23 DIAGNOSIS — Z6841 Body Mass Index (BMI) 40.0 and over, adult: Secondary | ICD-10-CM

## 2018-01-23 MED ORDER — VITAMIN D (ERGOCALCIFEROL) 1.25 MG (50000 UNIT) PO CAPS: 50000.0000 [IU] | ORAL_CAPSULE | ORAL | 0 refills | Status: DC

## 2018-01-28 NOTE — Progress Notes (Signed)
Office: [phone]  /  Fax: [phone]   HPI:   Chief Complaint: OBESITY Judith [name] is here to discuss her progress with her obesity treatment plan. She is on the Category 2 plan and is following her eating plan approximately 85 % of the time. She states she is exercising 0 minutes 0 times per week. Judith [name] is not used to eating at night, but is figuring it out. She is not having hunger and is getting approximately 7 ounces of meat in at dinner.  Her weight is 248 lb (112.5 kg) today and has had a weight loss of 5 pounds over a period of 2 weeks since her last visit. She has lost 5 lbs since starting treatment with [place].  Vitamin D deficiency Judith [name] has a diagnosis of vitamin D deficiency. She is currently taking an OTC vit D supplement.  Hyperlipidemia Judith [name] has hyperlipidemia and has had an elevated LDL on the last 3 blood draws.  Insulin Resistance Judith [name] has a diagnosis of insulin resistance based on her elevated fasting insulin level >5. Her last A1c was 5.2 and Insulin was 10.0. Although Judith [name]'s blood glucose readings are still under good control, insulin resistance puts her at greater risk of metabolic syndrome and diabetes.  At risk for osteopenia and osteoporosis Judith [name] is at higher risk of osteopenia and osteoporosis due to vitamin D deficiency.   ALLERGIES: No Known Allergies  MEDICATIONS: Current Outpatient Medications on File Prior to Visit  Medication Sig Dispense Refill  . Calcium-Magnesium-Vitamin D (CALCIUM 1200+D3 PO) Take 1 tablet by mouth daily. 1000U vit d    . Omega-3 Fatty Acids (FISH OIL) 1200 MG CAPS Take 1 capsule by mouth daily.    . vitamin C (ASCORBIC ACID) 500 MG tablet Take 500 mg by mouth daily.     No current facility-administered medications on file prior to visit.     PAST MEDICAL HISTORY: Past Medical History:  Diagnosis Date  . Obesity     PAST SURGICAL HISTORY: No past surgical history on file.  SOCIAL HISTORY: Social  History   Tobacco Use  . Smoking status: Former Smoker    Packs/day: 0.20    Years: 5.00    Pack years: 1.00    Types: Cigarettes    Last attempt to quit: 09/09/1986    Years since quitting: 31.4  . Smokeless tobacco: Never Used  Substance Use Topics  . Alcohol use: Yes    Alcohol/week: 14.0 standard drinks    Types: 14 Glasses of wine per week  . Drug use: No    FAMILY HISTORY: Family History  Problem Relation Age of Onset  . Cancer Mother 30       lymphoma  . Cancer Father        renal 1969, 1999  . Arthritis Father     ROS: Review of Systems  Constitutional: Positive for weight loss.    PHYSICAL EXAM: Blood pressure 130/80, pulse 79, temperature 98.4 F (36.9 C), temperature source Oral, height 5\' 4"  (1.626 m), weight 248 lb (112.5 kg), SpO2 98 %. Body mass index is 42.57 kg/m. Physical Exam  Constitutional: She is oriented to person, place, and time. She appears well-developed and well-nourished.  Cardiovascular: Normal rate.  Pulmonary/Chest: Effort normal.  Musculoskeletal: Normal range of motion.  Neurological: She is oriented to person, place, and time.  Skin: Skin is warm and dry.  Psychiatric: She has a normal mood and affect. Her behavior is normal.  Vitals reviewed.   RECENT LABS AND TESTS:  BMET    Component Value Date/Time   NA 142 10/12/2017 0946   K 4.8 10/12/2017 0946   CL 106 10/12/2017 0946   CO2 28 10/12/2017 0946   GLUCOSE 93 10/12/2017 0946   BUN 12 10/12/2017 0946   CREATININE 0.72 10/12/2017 0946   CALCIUM 9.7 10/12/2017 0946   GFRNONAA 95.00 12/29/2009 0943   Lab Results  Component Value Date   HGBA1C 5.2 10/12/2017   HGBA1C 5.2 01/18/2017   Lab Results  Component Value Date   INSULIN 10.0 01/09/2018   CBC    Component Value Date/Time   WBC 6.7 10/12/2017 0946   RBC 4.94 10/12/2017 0946   HGB 15.3 (H) 10/12/2017 0946   HCT 45.7 10/12/2017 0946   PLT 363.0 10/12/2017 0946   MCV 92.6 10/12/2017 0946   MCHC 33.5  10/12/2017 0946   RDW 13.6 10/12/2017 0946   LYMPHSABS 2.2 10/12/2017 0946   MONOABS 0.4 10/12/2017 0946   EOSABS 0.2 10/12/2017 0946   BASOSABS 0.0 10/12/2017 0946   Iron/TIBC/Ferritin/ %Sat No results found for: IRON, TIBC, FERRITIN, IRONPCTSAT Lipid Panel     Component Value Date/Time   CHOL 184 01/09/2018 1157   TRIG 76 01/09/2018 1157   HDL 50 01/09/2018 1157   CHOLHDL 4 10/12/2017 0946   VLDL 23.6 10/12/2017 0946   LDLCALC 119 (H) 01/09/2018 1157   Hepatic Function Panel     Component Value Date/Time   PROT 6.9 10/12/2017 0946   ALBUMIN 4.1 10/12/2017 0946   AST 18 10/12/2017 0946   ALT 23 10/12/2017 0946   ALKPHOS 103 10/12/2017 0946   BILITOT 0.4 10/12/2017 0946   BILIDIR 0.0 03/03/2013 1033      Component Value Date/Time   TSH 1.800 01/09/2018 1157   TSH 2.09 10/10/2016 0919   TSH 1.88 10/08/2015 1100   Results for Judith [name], Judith "[name]" (MRN [id number]) as of 01/28/2018 13:38  Ref. Range 01/09/2018 11:57  Vitamin D, 25-Hydroxy Latest Ref Range: 30.0 - 100.0 ng/mL 27.4 (L)   ASSESSMENT AND PLAN: Vitamin D deficiency - Plan: Vitamin D, Ergocalciferol, (DRISDOL) 50000 units CAPS capsule  Other hyperlipidemia  Insulin resistance  At risk for osteoporosis  Class 3 severe obesity with serious comorbidity and body mass index (BMI) of 40.0 to 44.9 in adult, unspecified obesity type ([place])  PLAN:  Vitamin D Deficiency Judith [name] was informed that low vitamin D levels contributes to fatigue and are associated with obesity, breast, and colon cancer. She agrees to start to take prescription Vit D @50 ,000 IU every week #4 with no refills and will follow up for routine testing of vitamin D, at least 2-3 times per year. She was informed of the risk of over-replacement of vitamin D and agrees to not increase her dose unless she discusses this with [place] first. She agrees to follow up in 2 weeks.  Hyperlipidemia Judith [name] was informed of the American Heart Association  Guidelines emphasizing intensive lifestyle modifications as the first line treatment for hyperlipidemia. We discussed many lifestyle modifications today in depth, and Judith [name] will continue to work on decreasing saturated fats such as fatty red meat, butter and many fried foods. She will also increase vegetables and lean protein in her diet and continue to work on exercise and weight loss efforts. We will recheck a FLP in 3 months. If her LDL has not changed, we will need to start a statin.   Insulin Resistance Judith [name] will continue to work on weight loss, exercise, and decreasing simple carbohydrates in her  diet to help decrease the risk of diabetes. She was informed that eating too many simple carbohydrates or too many calories at one sitting increases the likelihood of GI side effects. Judith [name] agreed to follow up with [place] as directed to monitor her progress. We will retest her Hgb A1c and insulin in 3 months.  At risk for osteopenia and osteoporosis Judith [name] was given extended (15 minutes) osteoporosis prevention counseling today. Judith [name] is at risk for osteopenia and osteoporosis due to her vitamin D deficiency. She was encouraged to take her vitamin D and follow her higher calcium diet and increase strengthening exercise to help strengthen her bones and decrease her risk of osteopenia and osteoporosis.  Obesity Judith [name] is currently in the action stage of change. As such, her goal is to continue with weight loss efforts. She has agreed to follow the Category 2 plan. Judith [name] has been instructed to work up to a goal of 150 minutes of combined cardio and strengthening exercise per week for weight loss and overall health benefits. We discussed the following Behavioral Modification Strategies today: increasing lean protein intake, increasing vegetables, work on meal planning and easy cooking plans, better snacking choices, and planning for success.  Amaziah has agreed to follow up with our clinic in 2  weeks. She was informed of the importance of frequent follow up visits to maximize her success with intensive lifestyle modifications for her multiple health conditions.   OBESITY BEHAVIORAL INTERVENTION VISIT  Today's visit was # 2  Starting weight: 253 lbs Starting date: 01/09/18 Today's weight : Weight: 248 lb (112.5 kg)  Today's date: 01/23/2018 Total lbs lost to date: 5  ASK: We discussed the diagnosis of obesity with Caren Griffins Judith [name] today and Leliana agreed to give [place] permission to discuss obesity behavioral modification therapy today.  ASSESS: Mialee has the diagnosis of obesity and her BMI today is 42.55. Dejai is in the action stage of change.   ADVISE: Aide was educated on the multiple health risks of obesity as well as the benefit of weight loss to improve her health. She was advised of the need for long term treatment and the importance of lifestyle modifications to improve her current health and to decrease her risk of future health problems.  AGREE: Multiple dietary modification options and treatment options were discussed and Rupa agreed to follow the recommendations documented in the above note.  ARRANGE: Lorriann was educated on the importance of frequent visits to treat obesity as outlined per CMS and USPSTF guidelines and agreed to schedule her next follow up appointment today.  I, Marcille Blanco, am acting as Location manager for Eber Jones, MD  I have reviewed the above documentation for accuracy and completeness, and I agree with the above. - Ilene Qua, MD

## 2018-01-31 ENCOUNTER — Encounter (INDEPENDENT_AMBULATORY_CARE_PROVIDER_SITE_OTHER): Payer: Self-pay

## 2018-02-05 ENCOUNTER — Ambulatory Visit (INDEPENDENT_AMBULATORY_CARE_PROVIDER_SITE_OTHER): Payer: BLUE CROSS/BLUE SHIELD | Admitting: Physician Assistant

## 2018-02-05 ENCOUNTER — Ambulatory Visit (INDEPENDENT_AMBULATORY_CARE_PROVIDER_SITE_OTHER): Payer: BLUE CROSS/BLUE SHIELD | Admitting: Family Medicine

## 2018-02-05 ENCOUNTER — Encounter (INDEPENDENT_AMBULATORY_CARE_PROVIDER_SITE_OTHER): Payer: Self-pay

## 2018-02-05 VITALS — BP 119/81 | Temp 98.3°F | Ht 64.0 in | Wt 243.0 lb

## 2018-02-05 DIAGNOSIS — Z6841 Body Mass Index (BMI) 40.0 and over, adult: Secondary | ICD-10-CM

## 2018-02-05 DIAGNOSIS — E559 Vitamin D deficiency, unspecified: Secondary | ICD-10-CM | POA: Diagnosis not present

## 2018-02-06 NOTE — Progress Notes (Signed)
Office: [phone]  /  Fax: [phone]   HPI:   Chief Complaint: OBESITY [name] [name] is here to discuss her progress with her obesity treatment plan. She is on the Category 2 plan and is following her eating plan approximately 95 % of the time. She states she is exercising 0 minutes 0 times per week. [name] [name] did very well with weight loss. She reports following the plan closely. She is using yogurt as her afternoon snack. [name] [name] has an upcoming birthday and she wants to talk about celebration strategies. Her weight is 243 lb (110.2 kg) today and has had a weight loss of 5 pounds over a period of 2 weeks since her last visit. She has lost 10 lbs since starting treatment with [place].  Vitamin D deficiency [name] [name] has a diagnosis of vitamin D deficiency. [name] [name] is currently taking vit D and her last level was not at goal. She  denies nausea, vomiting or muscle weakness.  ALLERGIES: No Known Allergies  MEDICATIONS: Current Outpatient Medications on File Prior to Visit  Medication Sig Dispense Refill  . Calcium-Magnesium-Vitamin D (CALCIUM 1200+D3 PO) Take 1 tablet by mouth daily. 1000U vit d    . Omega-3 Fatty Acids (FISH OIL) 1200 MG CAPS Take 1 capsule by mouth daily.    . vitamin C (ASCORBIC ACID) 500 MG tablet Take 500 mg by mouth daily.    . Vitamin D, Ergocalciferol, (DRISDOL) 50000 units CAPS capsule Take 1 capsule (50,000 Units total) by mouth every 7 (seven) days. 4 capsule 0   No current facility-administered medications on file prior to visit.     PAST MEDICAL HISTORY: Past Medical History:  Diagnosis Date  . Obesity     PAST SURGICAL HISTORY: No past surgical history on file.  SOCIAL HISTORY: Social History   Tobacco Use  . Smoking status: Former Smoker    Packs/day: 0.20    Years: 5.00    Pack years: 1.00    Types: Cigarettes    Last attempt to quit: 09/09/1986    Years since quitting: 31.4  . Smokeless tobacco: Never Used  Substance Use Topics  . Alcohol  use: Yes    Alcohol/week: 14.0 standard drinks    Types: 14 Glasses of wine per week  . Drug use: No    FAMILY HISTORY: Family History  Problem Relation Age of Onset  . Cancer Mother 40       lymphoma  . Cancer Father        renal 1969, 1999  . Arthritis Father     ROS: Review of Systems  Constitutional: Positive for weight loss.  Gastrointestinal: Negative for nausea and vomiting.  Musculoskeletal:       Negative for muscle weakness    PHYSICAL EXAM: Blood pressure 119/81, temperature 98.3 F (36.8 C), temperature source Oral, height 5\' 4"  (1.626 m), weight 243 lb (110.2 kg). Body mass index is 41.71 kg/m. Physical Exam  Constitutional: She is oriented to person, place, and time. She appears well-developed and well-nourished.  Cardiovascular: Normal rate.  Pulmonary/Chest: Effort normal.  Musculoskeletal: Normal range of motion.  Neurological: She is oriented to person, place, and time.  Skin: Skin is warm and dry.  Psychiatric: She has a normal mood and affect. Her behavior is normal.  Vitals reviewed.   RECENT LABS AND TESTS: BMET    Component Value Date/Time   NA 142 10/12/2017 0946   K 4.8 10/12/2017 0946   CL 106 10/12/2017 0946   CO2 28 10/12/2017 0946  GLUCOSE 93 10/12/2017 0946   BUN 12 10/12/2017 0946   CREATININE 0.72 10/12/2017 0946   CALCIUM 9.7 10/12/2017 0946   GFRNONAA 95.00 12/29/2009 0943   Lab Results  Component Value Date   HGBA1C 5.2 10/12/2017   HGBA1C 5.2 01/18/2017   Lab Results  Component Value Date   INSULIN 10.0 01/09/2018   CBC    Component Value Date/Time   WBC 6.7 10/12/2017 0946   RBC 4.94 10/12/2017 0946   HGB 15.3 (H) 10/12/2017 0946   HCT 45.7 10/12/2017 0946   PLT 363.0 10/12/2017 0946   MCV 92.6 10/12/2017 0946   MCHC 33.5 10/12/2017 0946   RDW 13.6 10/12/2017 0946   LYMPHSABS 2.2 10/12/2017 0946   MONOABS 0.4 10/12/2017 0946   EOSABS 0.2 10/12/2017 0946   BASOSABS 0.0 10/12/2017 0946    Iron/TIBC/Ferritin/ %Sat No results found for: IRON, TIBC, FERRITIN, IRONPCTSAT Lipid Panel     Component Value Date/Time   CHOL 184 01/09/2018 1157   TRIG 76 01/09/2018 1157   HDL 50 01/09/2018 1157   CHOLHDL 4 10/12/2017 0946   VLDL 23.6 10/12/2017 0946   LDLCALC 119 (H) 01/09/2018 1157   Hepatic Function Panel     Component Value Date/Time   PROT 6.9 10/12/2017 0946   ALBUMIN 4.1 10/12/2017 0946   AST 18 10/12/2017 0946   ALT 23 10/12/2017 0946   ALKPHOS 103 10/12/2017 0946   BILITOT 0.4 10/12/2017 0946   BILIDIR 0.0 03/03/2013 1033      Component Value Date/Time   TSH 1.800 01/09/2018 1157   TSH 2.09 10/10/2016 0919   TSH 1.88 10/08/2015 1100   Results for [name] [name] "Judith [name]" (MRN [id number]) as of 02/06/2018 08:13  Ref. Range 01/09/2018 11:57  Vitamin D, 25-Hydroxy Latest Ref Range: 30.0 - 100.0 ng/mL 27.4 (L)   ASSESSMENT AND PLAN: Vitamin D deficiency  Class 3 severe obesity with serious comorbidity and body mass index (BMI) of 40.0 to 44.9 in adult, unspecified obesity type ([place])  PLAN:  Vitamin D Deficiency [name] was informed that low vitamin D levels contributes to fatigue and are associated with obesity, breast, and colon cancer. She agrees to continue to take prescription Vit D @50 ,000 IU every week and will follow up for routine testing of vitamin D, at least 2-3 times per year. She was informed of the risk of over-replacement of vitamin D and agrees to not increase her dose unless she discusses this with [place] first.  I spent > than 50% of the 15 minute visit on counseling as documented in the note.  Obesity [name] [name] is currently in the action stage of change. As such, her goal is to continue with weight loss efforts She has agreed to follow the Category 2 plan [name] [name] has been instructed to work up to a goal of 150 minutes of combined cardio and strengthening exercise per week for weight loss and overall health benefits. We discussed the  following Behavioral Modification Strategies today: work on meal planning and easy cooking plans, celebration eating strategies and ways to avoid boredom eating  [name] [name] has agreed to follow up with our clinic in 2 weeks. She was informed of the importance of frequent follow up visits to maximize her success with intensive lifestyle modifications for her multiple health conditions.   OBESITY BEHAVIORAL INTERVENTION VISIT  Today's visit was # 3   Starting weight: 253 lbs Starting date: 01/09/18 Today's weight : 243 lbs  Today's date: 02/05/2018 Total lbs lost to date: 82  ASK: We discussed the diagnosis of obesity with [name] [name] today and [name] [name] agreed to give [place] permission to discuss obesity behavioral modification therapy today.  ASSESS: [name] [name] has the diagnosis of obesity and her BMI today is 41.69 [name] [name] is in the action stage of change   ADVISE: [name] [name] was educated on the multiple health risks of obesity as well as the benefit of weight loss to improve her health. She was advised of the need for long term treatment and the importance of lifestyle modifications to improve her current health and to decrease her risk of future health problems.  AGREE: Multiple dietary modification options and treatment options were discussed and  [name] [name] agreed to follow the recommendations documented in the above note.  ARRANGE: [name] [name] was educated on the importance of frequent visits to treat obesity as outlined per CMS and USPSTF guidelines and agreed to schedule her next follow up appointment today.  Corey Skains, am acting as transcriptionist for Abby Potash, PA-C I, Abby Potash, PA-C have reviewed above note and agree with its content

## 2018-02-20 ENCOUNTER — Ambulatory Visit (INDEPENDENT_AMBULATORY_CARE_PROVIDER_SITE_OTHER): Payer: BLUE CROSS/BLUE SHIELD | Admitting: Physician Assistant

## 2018-02-20 ENCOUNTER — Encounter (INDEPENDENT_AMBULATORY_CARE_PROVIDER_SITE_OTHER): Payer: Self-pay | Admitting: Physician Assistant

## 2018-02-20 VITALS — BP 135/82 | HR 69 | Temp 98.2°F | Ht 64.0 in | Wt 237.0 lb

## 2018-02-20 DIAGNOSIS — E8881 Metabolic syndrome: Secondary | ICD-10-CM

## 2018-02-20 DIAGNOSIS — Z9189 Other specified personal risk factors, not elsewhere classified: Secondary | ICD-10-CM | POA: Diagnosis not present

## 2018-02-20 DIAGNOSIS — E559 Vitamin D deficiency, unspecified: Secondary | ICD-10-CM | POA: Diagnosis not present

## 2018-02-20 DIAGNOSIS — Z6841 Body Mass Index (BMI) 40.0 and over, adult: Secondary | ICD-10-CM

## 2018-02-20 MED ORDER — VITAMIN D (ERGOCALCIFEROL) 1.25 MG (50000 UNIT) PO CAPS: 50000.0000 [IU] | ORAL_CAPSULE | ORAL | 0 refills | Status: DC

## 2018-02-21 NOTE — Progress Notes (Signed)
.   Office: [phone]  /  Fax: [phone]   HPI:   Chief Complaint: OBESITY Judith [name] is here to discuss her progress with her obesity treatment plan. She is on the Category 2 plan and is following her eating plan approximately 95 % of the time. She states she is exercising 0 minutes 0 times per week. Judith [name] did very well with weight loss. She reports that she remained on the plan even while traveling for work.  Her weight is 237 lb (107.5 kg) today and has had a weight loss of 6 pounds over a period of 2 weeks since her last visit. She has lost 16 lbs since starting treatment with [place].  Vitamin D Deficiency Judith [name] has a diagnosis of vitamin D deficiency. She is on prescription Vit D and denies nausea, vomiting or muscle weakness.  At risk for osteopenia and osteoporosis Judith [name] is at higher risk of osteopenia and osteoporosis due to vitamin D deficiency.   Insulin Resistance Judith [name] has a diagnosis of insulin resistance based on her elevated fasting insulin level >5. Although Judith [name]'s blood glucose readings are still under good control, insulin resistance puts her at greater risk of metabolic syndrome and diabetes. She is not taking metformin currently and continues to work on diet and exercise to decrease risk of diabetes. She denies polyphagia.  ALLERGIES: No Known Allergies  MEDICATIONS: Current Outpatient Medications on File Prior to Visit  Medication Sig Dispense Refill  . Calcium-Magnesium-Vitamin D (CALCIUM 1200+D3 PO) Take 1 tablet by mouth daily. 1000U vit d    . Omega-3 Fatty Acids (FISH OIL) 1200 MG CAPS Take 1 capsule by mouth daily.    . vitamin C (ASCORBIC ACID) 500 MG tablet Take 500 mg by mouth daily.     No current facility-administered medications on file prior to visit.     PAST MEDICAL HISTORY: Past Medical History:  Diagnosis Date  . Obesity     PAST SURGICAL HISTORY: No past surgical history on file.  SOCIAL HISTORY: Social History   Tobacco  Use  . Smoking status: Former Smoker    Packs/day: 0.20    Years: 5.00    Pack years: 1.00    Types: Cigarettes    Last attempt to quit: 09/09/1986    Years since quitting: 31.4  . Smokeless tobacco: Never Used  Substance Use Topics  . Alcohol use: Yes    Alcohol/week: 14.0 standard drinks    Types: 14 Glasses of wine per week  . Drug use: No    FAMILY HISTORY: Family History  Problem Relation Age of Onset  . Cancer Mother 81       lymphoma  . Cancer Father        renal 1969, 1999  . Arthritis Father     ROS: Review of Systems  Constitutional: Positive for weight loss.  Gastrointestinal: Negative for nausea and vomiting.  Musculoskeletal:       Negative muscle weakness  Endo/Heme/Allergies:       Negative polyphagia    PHYSICAL EXAM: Blood pressure 135/82, pulse 69, temperature 98.2 F (36.8 C), temperature source Oral, height 5\' 4"  (1.626 m), weight 237 lb (107.5 kg), SpO2 97 %. Body mass index is 40.68 kg/m. Physical Exam  Constitutional: She is oriented to person, place, and time. She appears well-developed and well-nourished.  Cardiovascular: Normal rate.  Pulmonary/Chest: Effort normal.  Musculoskeletal: Normal range of motion.  Neurological: She is oriented to person, place, and time.  Skin: Skin is warm and dry.  Psychiatric: She has a normal mood and affect. Her behavior is normal.  Vitals reviewed.   RECENT LABS AND TESTS: BMET    Component Value Date/Time   NA 142 10/12/2017 0946   K 4.8 10/12/2017 0946   CL 106 10/12/2017 0946   CO2 28 10/12/2017 0946   GLUCOSE 93 10/12/2017 0946   BUN 12 10/12/2017 0946   CREATININE 0.72 10/12/2017 0946   CALCIUM 9.7 10/12/2017 0946   GFRNONAA 95.00 12/29/2009 0943   Lab Results  Component Value Date   HGBA1C 5.2 10/12/2017   HGBA1C 5.2 01/18/2017   Lab Results  Component Value Date   INSULIN 10.0 01/09/2018   CBC    Component Value Date/Time   WBC 6.7 10/12/2017 0946   RBC 4.94 10/12/2017 0946     HGB 15.3 (H) 10/12/2017 0946   HCT 45.7 10/12/2017 0946   PLT 363.0 10/12/2017 0946   MCV 92.6 10/12/2017 0946   MCHC 33.5 10/12/2017 0946   RDW 13.6 10/12/2017 0946   LYMPHSABS 2.2 10/12/2017 0946   MONOABS 0.4 10/12/2017 0946   EOSABS 0.2 10/12/2017 0946   BASOSABS 0.0 10/12/2017 0946   Iron/TIBC/Ferritin/ %Sat No results found for: IRON, TIBC, FERRITIN, IRONPCTSAT Lipid Panel     Component Value Date/Time   CHOL 184 01/09/2018 1157   TRIG 76 01/09/2018 1157   HDL 50 01/09/2018 1157   CHOLHDL 4 10/12/2017 0946   VLDL 23.6 10/12/2017 0946   LDLCALC 119 (H) 01/09/2018 1157   Hepatic Function Panel     Component Value Date/Time   PROT 6.9 10/12/2017 0946   ALBUMIN 4.1 10/12/2017 0946   AST 18 10/12/2017 0946   ALT 23 10/12/2017 0946   ALKPHOS 103 10/12/2017 0946   BILITOT 0.4 10/12/2017 0946   BILIDIR 0.0 03/03/2013 1033      Component Value Date/Time   TSH 1.800 01/09/2018 1157   TSH 2.09 10/10/2016 0919   TSH 1.88 10/08/2015 1100  Results for [name], Judith "[name]" (MRN [id number]) as of 02/21/2018 09:22  Ref. Range 01/09/2018 11:57  Vitamin D, 25-Hydroxy Latest Ref Range: 30.0 - 100.0 ng/mL 27.4 (L)    ASSESSMENT AND PLAN: Vitamin D deficiency - Plan: Vitamin D, Ergocalciferol, (DRISDOL) 50000 units CAPS capsule  Insulin resistance  At risk for osteoporosis  Class 3 severe obesity with serious comorbidity and body mass index (BMI) of 40.0 to 44.9 in adult, unspecified obesity type ([place])  PLAN:  Vitamin D Deficiency Judith [name] was informed that low vitamin D levels contributes to fatigue and are associated with obesity, breast, and colon cancer. Judith [name] agrees to continue taking prescription Vit D @50 ,000 IU every week #4 and we will refill for 1 month. She will follow up for routine testing of vitamin D, at least 2-3 times per year. She was informed of the risk of over-replacement of vitamin D and agrees to not increase her dose unless she discusses  this with [place] first. Judith [name] agrees to follow up with our clinic in 2 weeks.  At risk for osteopenia and osteoporosis Judith [name] was given extended (15 minutes) osteoporosis prevention counseling today. Judith [name] is at risk for osteopenia and osteoporsis due to her vitamin D deficiency. She was encouraged to take her vitamin D and follow her higher calcium diet and increase strengthening exercise to help strengthen her bones and decrease her risk of osteopenia and osteoporosis.  Insulin Resistance Judith [name] will continue to work on weight loss, diet, exercise, and decreasing simple carbohydrates in her diet to help decrease the  risk of diabetes. We dicussed metformin including benefits and risks. She was informed that eating too many simple carbohydrates or too many calories at one sitting increases the likelihood of GI side effects. Judith [name] declined metformin for now and prescription was not written today. Judith [name] agrees to follow up with our clinic in 2 weeks as directed to monitor her progress.  Obesity Judith [name] is currently in the action stage of change. As such, her goal is to continue with weight loss efforts She has agreed to follow the Category 2 plan Judith [name] has been instructed to work up to a goal of 150 minutes of combined cardio and strengthening exercise per week for weight loss and overall health benefits. We discussed the following Behavioral Modification Strategies today: work on meal planning and easy cooking plans and planning for success   Judith [name] has agreed to follow up with our clinic in 2 weeks. She was informed of the importance of frequent follow up visits to maximize her success with intensive lifestyle modifications for her multiple health conditions.   OBESITY BEHAVIORAL INTERVENTION VISIT  Today's visit was # 4  Starting weight: 253 lbs Starting date: 01/09/18 Today's weight : 237 lbs Today's date: 02/20/2018 Total lbs lost to date: 16    ASK: We discussed the  diagnosis of obesity with Judith [name] today and Judith [name] agreed to give [place] permission to discuss obesity behavioral modification therapy today.  ASSESS: Judith [name] has the diagnosis of obesity and her BMI today is 40.66 Judith [name] is in the action stage of change   ADVISE: Judith [name] was educated on the multiple health risks of obesity as well as the benefit of weight loss to improve her health. She was advised of the need for long term treatment and the importance of lifestyle modifications.  AGREE: Multiple dietary modification options and treatment options were discussed and  Judith [name] agreed to the above obesity treatment plan.  Wilhemena Durie, am acting as transcriptionist for Abby Potash, PA-C I, Abby Potash, PA-C have reviewed above note and agree with its content

## 2018-03-07 ENCOUNTER — Ambulatory Visit (INDEPENDENT_AMBULATORY_CARE_PROVIDER_SITE_OTHER): Payer: BLUE CROSS/BLUE SHIELD | Admitting: Physician Assistant

## 2018-03-13 ENCOUNTER — Ambulatory Visit (INDEPENDENT_AMBULATORY_CARE_PROVIDER_SITE_OTHER): Payer: BLUE CROSS/BLUE SHIELD | Admitting: Physician Assistant

## 2018-03-13 ENCOUNTER — Encounter (INDEPENDENT_AMBULATORY_CARE_PROVIDER_SITE_OTHER): Payer: Self-pay | Admitting: Physician Assistant

## 2018-03-13 VITALS — BP 116/80 | HR 77 | Temp 97.8°F | Ht 64.0 in | Wt 232.0 lb

## 2018-03-13 DIAGNOSIS — E8881 Metabolic syndrome: Secondary | ICD-10-CM

## 2018-03-13 DIAGNOSIS — Z6839 Body mass index (BMI) 39.0-39.9, adult: Secondary | ICD-10-CM

## 2018-03-13 DIAGNOSIS — E559 Vitamin D deficiency, unspecified: Secondary | ICD-10-CM | POA: Diagnosis not present

## 2018-03-13 DIAGNOSIS — Z9189 Other specified personal risk factors, not elsewhere classified: Secondary | ICD-10-CM | POA: Diagnosis not present

## 2018-03-13 MED ORDER — VITAMIN D (ERGOCALCIFEROL) 1.25 MG (50000 UNIT) PO CAPS: 50000.0000 [IU] | ORAL_CAPSULE | ORAL | 0 refills | Status: DC

## 2018-03-13 NOTE — Progress Notes (Signed)
Office: [phone]  /  Fax: [phone]   HPI:   Chief Complaint: OBESITY Judith [name] is here to discuss her progress with her obesity treatment plan. She is on the Category 2 plan and is following her eating plan approximately 85 % of the time. She states she is exercising 0 minutes 0 times per week. Judith [name] did very well with weight loss. She reports that she eats out often for work and is traveling a lot recently.  Her weight is 232 lb (105.2 kg) today and has had a weight loss of 5 pounds over a period of 3 weeks since her last visit. She has lost 21 lbs since starting treatment with [place].  Vitamin D Deficiency Judith [name] has a diagnosis of vitamin D deficiency. She is currently taking prescription Vit D and denies nausea, vomiting or muscle weakness.  At risk for osteopenia and osteoporosis Judith [name] is at higher risk of osteopenia and osteoporosis due to vitamin D deficiency.   Insulin Resistance Judith [name] has a diagnosis of insulin resistance based on her elevated fasting insulin level >5. Although [name]'s blood glucose readings are still under good control, insulin resistance puts her at greater risk of metabolic syndrome and diabetes. She is not taking metformin currently and continues to work on diet and exercise to decrease risk of diabetes. She denies polyphagia.  ALLERGIES: No Known Allergies  MEDICATIONS: Current Outpatient Medications on File Prior to Visit  Medication Sig Dispense Refill  . Calcium-Magnesium-Vitamin D (CALCIUM 1200+D3 PO) Take 1 tablet by mouth daily. 1000U vit d    . Omega-3 Fatty Acids (FISH OIL) 1200 MG CAPS Take 1 capsule by mouth daily.    . vitamin C (ASCORBIC ACID) 500 MG tablet Take 500 mg by mouth daily.     No current facility-administered medications on file prior to visit.     PAST MEDICAL HISTORY: Past Medical History:  Diagnosis Date  . Obesity     PAST SURGICAL HISTORY: No past surgical history on file.  SOCIAL HISTORY: Social  History   Tobacco Use  . Smoking status: Former Smoker    Packs/day: 0.20    Years: 5.00    Pack years: 1.00    Types: Cigarettes    Last attempt to quit: 09/09/1986    Years since quitting: 31.5  . Smokeless tobacco: Never Used  Substance Use Topics  . Alcohol use: Yes    Alcohol/week: 14.0 standard drinks    Types: 14 Glasses of wine per week  . Drug use: No    FAMILY HISTORY: Family History  Problem Relation Age of Onset  . Cancer Mother 25       lymphoma  . Cancer Father        renal 1969, 1999  . Arthritis Father     ROS: Review of Systems  Constitutional: Positive for weight loss.  Gastrointestinal: Negative for nausea and vomiting.  Musculoskeletal:       Negative muscle weakness  Endo/Heme/Allergies:       Negative polyphagia    PHYSICAL EXAM: Blood pressure 116/80, pulse 77, temperature 97.8 F (36.6 C), temperature source Oral, height 5\' 4"  (1.626 m), weight 232 lb (105.2 kg), SpO2 100 %. Body mass index is 39.82 kg/m. Physical Exam  Constitutional: She is oriented to person, place, and time. She appears well-developed and well-nourished.  Cardiovascular: Normal rate.  Pulmonary/Chest: Effort normal.  Musculoskeletal: Normal range of motion.  Neurological: She is oriented to person, place, and time.  Skin: Skin is warm and dry.  Psychiatric: She has a normal mood and affect. Her behavior is normal.  Vitals reviewed.   RECENT LABS AND TESTS: BMET    Component Value Date/Time   NA 142 10/12/2017 0946   K 4.8 10/12/2017 0946   CL 106 10/12/2017 0946   CO2 28 10/12/2017 0946   GLUCOSE 93 10/12/2017 0946   BUN 12 10/12/2017 0946   CREATININE 0.72 10/12/2017 0946   CALCIUM 9.7 10/12/2017 0946   GFRNONAA 95.00 12/29/2009 0943   Lab Results  Component Value Date   HGBA1C 5.2 10/12/2017   HGBA1C 5.2 01/18/2017   Lab Results  Component Value Date   INSULIN 10.0 01/09/2018   CBC    Component Value Date/Time   WBC 6.7 10/12/2017 0946   RBC  4.94 10/12/2017 0946   HGB 15.3 (H) 10/12/2017 0946   HCT 45.7 10/12/2017 0946   PLT 363.0 10/12/2017 0946   MCV 92.6 10/12/2017 0946   MCHC 33.5 10/12/2017 0946   RDW 13.6 10/12/2017 0946   LYMPHSABS 2.2 10/12/2017 0946   MONOABS 0.4 10/12/2017 0946   EOSABS 0.2 10/12/2017 0946   BASOSABS 0.0 10/12/2017 0946   Iron/TIBC/Ferritin/ %Sat No results found for: IRON, TIBC, FERRITIN, IRONPCTSAT Lipid Panel     Component Value Date/Time   CHOL 184 01/09/2018 1157   TRIG 76 01/09/2018 1157   HDL 50 01/09/2018 1157   CHOLHDL 4 10/12/2017 0946   VLDL 23.6 10/12/2017 0946   LDLCALC 119 (H) 01/09/2018 1157   Hepatic Function Panel     Component Value Date/Time   PROT 6.9 10/12/2017 0946   ALBUMIN 4.1 10/12/2017 0946   AST 18 10/12/2017 0946   ALT 23 10/12/2017 0946   ALKPHOS 103 10/12/2017 0946   BILITOT 0.4 10/12/2017 0946   BILIDIR 0.0 03/03/2013 1033      Component Value Date/Time   TSH 1.800 01/09/2018 1157   TSH 2.09 10/10/2016 0919   TSH 1.88 10/08/2015 1100  Results for [name], Joscelynn "[name]" (MRN [id number]) as of 03/13/2018 18:04  Ref. Range 01/09/2018 11:57  Vitamin D, 25-Hydroxy Latest Ref Range: 30.0 - 100.0 ng/mL 27.4 (L)    ASSESSMENT AND PLAN: Vitamin D deficiency - Plan: Vitamin D, Ergocalciferol, (DRISDOL) 1.25 MG (50000 UT) CAPS capsule  Insulin resistance  At risk for osteoporosis  Class 2 severe obesity with serious comorbidity and body mass index (BMI) of 39.0 to 39.9 in adult, unspecified obesity type ([place])  PLAN:  Vitamin D Deficiency [name] was informed that low vitamin D levels contributes to fatigue and are associated with obesity, breast, and colon cancer. [name] agrees to continue taking prescription Vit D @50 ,000 IU every week #4 and we will refill for 1 month. She will follow up for routine testing of vitamin D, at least 2-3 times per year. She was informed of the risk of over-replacement of vitamin D and agrees to not increase her  dose unless she discusses this with [place] first. Betsaida agrees to follow up with our clinic in 2 weeks.  At risk for osteopenia and osteoporosis [name] was given extended (15 minutes) osteoporosis prevention counseling today. [name] is at risk for osteopenia and osteoporsis due to her vitamin D deficiency. She was encouraged to take her vitamin D and follow her higher calcium diet and increase strengthening exercise to help strengthen her bones and decrease her risk of osteopenia and osteoporosis.  Insulin Resistance Yamira will continue to work on weight loss, diet, exercise, and decreasing simple carbohydrates in her diet to help decrease  the risk of diabetes. We dicussed metformin including benefits and risks. She was informed that eating too many simple carbohydrates or too many calories at one sitting increases the likelihood of GI side effects. Tyne declined metformin for now and prescription was not written today. Rosaleigh agrees to follow up with our clinic in 2 weeks as directed to monitor her progress.  Obesity Elexia is currently in the action stage of change. As such, her goal is to continue with weight loss efforts She has agreed to follow the Category 2 plan Zion has been instructed to work up to a goal of 150 minutes of combined cardio and strengthening exercise per week for weight loss and overall health benefits. We discussed the following Behavioral Modification Strategies today: work on meal planning and easy cooking plans and ways to avoid boredom eating   Jerline has agreed to follow up with our clinic in 2 weeks. She was informed of the importance of frequent follow up visits to maximize her success with intensive lifestyle modifications for her multiple health conditions.   OBESITY BEHAVIORAL INTERVENTION VISIT  Today's visit was # 5   Starting weight: 253 lbs Starting date: 01/09/18 Today's weight : 232 lbs  Today's date: 03/13/2018 Total lbs lost to date:  21    ASK: We discussed the diagnosis of obesity with Caren Griffins [name] today and Vicky agreed to give [place] permission to discuss obesity behavioral modification therapy today.  ASSESS: Preeti has the diagnosis of obesity and her BMI today is 39.8 Therisa is in the action stage of change   ADVISE: Javon was educated on the multiple health risks of obesity as well as the benefit of weight loss to improve her health. She was advised of the need for long term treatment and the importance of lifestyle modifications.  AGREE: Multiple dietary modification options and treatment options were discussed and  Hiral agreed to the above obesity treatment plan.  Wilhemena Durie, am acting as transcriptionist for Abby Potash, PA-C I, Abby Potash, PA-C have reviewed above note and agree with its content

## 2018-03-27 ENCOUNTER — Ambulatory Visit (INDEPENDENT_AMBULATORY_CARE_PROVIDER_SITE_OTHER): Payer: BLUE CROSS/BLUE SHIELD | Admitting: Physician Assistant

## 2018-03-27 VITALS — BP 118/78 | HR 68 | Temp 98.2°F | Ht 64.0 in | Wt 227.0 lb

## 2018-03-27 DIAGNOSIS — E559 Vitamin D deficiency, unspecified: Secondary | ICD-10-CM

## 2018-03-27 DIAGNOSIS — Z6839 Body mass index (BMI) 39.0-39.9, adult: Secondary | ICD-10-CM | POA: Diagnosis not present

## 2018-04-01 NOTE — Progress Notes (Signed)
Office: [phone]  /  Fax: [phone]   HPI:   Chief Complaint: OBESITY Judith [name] is here to discuss her progress with her obesity treatment plan. She is on the Category 2 plan and is following her eating plan approximately 50 % of the time. She states she is exercising 0 minutes 0 times per week. Ishani did very well with weight loss. She reports eating protein and vegetables when eating out. She wants to talk about holiday eating strategies.  Her weight is 227 lb (103 kg) today and has had a weight loss of 5 pounds over a period of 2 weeks since her last visit. She has lost 26 lbs since starting treatment with [place].  Vitamin D Deficiency Judith [name] has a diagnosis of vitamin D deficiency. She is currently taking prescription Vit D and denies nausea, vomiting or muscle weakness.  ALLERGIES: No Known Allergies  MEDICATIONS: Current Outpatient Medications on File Prior to Visit  Medication Sig Dispense Refill  . Calcium-Magnesium-Vitamin D (CALCIUM 1200+D3 PO) Take 1 tablet by mouth daily. 1000U vit d    . Omega-3 Fatty Acids (FISH OIL) 1200 MG CAPS Take 1 capsule by mouth daily.    . vitamin C (ASCORBIC ACID) 500 MG tablet Take 500 mg by mouth daily.    . Vitamin D, Ergocalciferol, (DRISDOL) 1.25 MG (50000 UT) CAPS capsule Take 1 capsule (50,000 Units total) by mouth every 7 (seven) days. 4 capsule 0   No current facility-administered medications on file prior to visit.     PAST MEDICAL HISTORY: Past Medical History:  Diagnosis Date  . Obesity     PAST SURGICAL HISTORY: No past surgical history on file.  SOCIAL HISTORY: Social History   Tobacco Use  . Smoking status: Former Smoker    Packs/day: 0.20    Years: 5.00    Pack years: 1.00    Types: Cigarettes    Last attempt to quit: 09/09/1986    Years since quitting: 31.5  . Smokeless tobacco: Never Used  Substance Use Topics  . Alcohol use: Yes    Alcohol/week: 14.0 standard drinks    Types: 14 Glasses of wine per  week  . Drug use: No    FAMILY HISTORY: Family History  Problem Relation Age of Onset  . Cancer Mother 3       lymphoma  . Cancer Father        renal 1969, 1999  . Arthritis Father     ROS: Review of Systems  Constitutional: Positive for weight loss.  Gastrointestinal: Negative for nausea and vomiting.  Musculoskeletal:       Negative muscle weakness    PHYSICAL EXAM: Blood pressure 118/78, pulse 68, temperature 98.2 F (36.8 C), temperature source Oral, height 5\' 4"  (1.626 m), weight 227 lb (103 kg), SpO2 97 %. Body mass index is 38.96 kg/m. Physical Exam  Constitutional: She is oriented to person, place, and time. She appears well-developed and well-nourished.  Cardiovascular: Normal rate.  Pulmonary/Chest: Effort normal.  Musculoskeletal: Normal range of motion.  Neurological: She is oriented to person, place, and time.  Skin: Skin is warm and dry.  Psychiatric: She has a normal mood and affect. Her behavior is normal.  Vitals reviewed.   RECENT LABS AND TESTS: BMET    Component Value Date/Time   NA 142 10/12/2017 0946   K 4.8 10/12/2017 0946   CL 106 10/12/2017 0946   CO2 28 10/12/2017 0946   GLUCOSE 93 10/12/2017 0946   BUN 12 10/12/2017 0946  CREATININE 0.72 10/12/2017 0946   CALCIUM 9.7 10/12/2017 0946   GFRNONAA 95.00 12/29/2009 0943   Lab Results  Component Value Date   HGBA1C 5.2 10/12/2017   HGBA1C 5.2 01/18/2017   Lab Results  Component Value Date   INSULIN 10.0 01/09/2018   CBC    Component Value Date/Time   WBC 6.7 10/12/2017 0946   RBC 4.94 10/12/2017 0946   HGB 15.3 (H) 10/12/2017 0946   HCT 45.7 10/12/2017 0946   PLT 363.0 10/12/2017 0946   MCV 92.6 10/12/2017 0946   MCHC 33.5 10/12/2017 0946   RDW 13.6 10/12/2017 0946   LYMPHSABS 2.2 10/12/2017 0946   MONOABS 0.4 10/12/2017 0946   EOSABS 0.2 10/12/2017 0946   BASOSABS 0.0 10/12/2017 0946   Iron/TIBC/Ferritin/ %Sat No results found for: IRON, TIBC, FERRITIN,  IRONPCTSAT Lipid Panel     Component Value Date/Time   CHOL 184 01/09/2018 1157   TRIG 76 01/09/2018 1157   HDL 50 01/09/2018 1157   CHOLHDL 4 10/12/2017 0946   VLDL 23.6 10/12/2017 0946   LDLCALC 119 (H) 01/09/2018 1157   Hepatic Function Panel     Component Value Date/Time   PROT 6.9 10/12/2017 0946   ALBUMIN 4.1 10/12/2017 0946   AST 18 10/12/2017 0946   ALT 23 10/12/2017 0946   ALKPHOS 103 10/12/2017 0946   BILITOT 0.4 10/12/2017 0946   BILIDIR 0.0 03/03/2013 1033      Component Value Date/Time   TSH 1.800 01/09/2018 1157   TSH 2.09 10/10/2016 0919   TSH 1.88 10/08/2015 1100  Results for [name], Judith "[name]" (MRN [id number]) as of 04/01/2018 12:37  Ref. Range 01/09/2018 11:57  Vitamin D, 25-Hydroxy Latest Ref Range: 30.0 - 100.0 ng/mL 27.4 (L)    ASSESSMENT AND PLAN: Vitamin D deficiency  Class 2 severe obesity with serious comorbidity and body mass index (BMI) of 39.0 to 39.9 in adult, unspecified obesity type ([place])  PLAN:  Vitamin D Deficiency Cypress was informed that low vitamin D levels contributes to fatigue and are associated with obesity, breast, and colon cancer. Malaka agrees to continue taking prescription Vit D @50 ,000 IU every week and will follow up for routine testing of vitamin D, at least 2-3 times per year. She was informed of the risk of over-replacement of vitamin D and agrees to not increase her dose unless she discusses this with [place] first. Maguire agrees to follow up with our clinic in 2 weeks.  I spent > than 50% of the 15 minute visit on counseling as documented in the note.  Obesity Amelda is currently in the action stage of change. As such, her goal is to continue with weight loss efforts She has agreed to follow the Category 2 plan Suheily has been instructed to work up to a goal of 150 minutes of combined cardio and strengthening exercise per week for weight loss and overall health benefits. We discussed the following  Behavioral Modification Strategies today: work on meal planning and easy cooking plans and holiday eating strategies    Sanaai has agreed to follow up with our clinic in 2 weeks. She was informed of the importance of frequent follow up visits to maximize her success with intensive lifestyle modifications for her multiple health conditions.   OBESITY BEHAVIORAL INTERVENTION VISIT  Today's visit was # 6   Starting weight: 253 lbs Starting date: 01/09/18 Today's weight : 227 lbs Today's date: 03/27/2018 Total lbs lost to date: 53    ASK: We discussed the diagnosis  of obesity with Caren Griffins [name] today and Tracie agreed to give [place] permission to discuss obesity behavioral modification therapy today.  ASSESS: Norman has the diagnosis of obesity and her BMI today is 38.95 Lexey is in the action stage of change   ADVISE: Wynter was educated on the multiple health risks of obesity as well as the benefit of weight loss to improve her health. She was advised of the need for long term treatment and the importance of lifestyle modifications.  AGREE: Multiple dietary modification options and treatment options were discussed and  Andrey agreed to the above obesity treatment plan.  Wilhemena Durie, am acting as transcriptionist for Abby Potash, PA-C I, Abby Potash, PA-C have reviewed above note and agree with its content

## 2018-04-11 ENCOUNTER — Ambulatory Visit (INDEPENDENT_AMBULATORY_CARE_PROVIDER_SITE_OTHER): Payer: BLUE CROSS/BLUE SHIELD | Admitting: Family Medicine

## 2018-04-11 ENCOUNTER — Encounter (INDEPENDENT_AMBULATORY_CARE_PROVIDER_SITE_OTHER): Payer: Self-pay | Admitting: Family Medicine

## 2018-04-11 VITALS — BP 91/58 | HR 61 | Temp 97.8°F | Ht 64.0 in | Wt 222.0 lb

## 2018-04-11 DIAGNOSIS — Z9189 Other specified personal risk factors, not elsewhere classified: Secondary | ICD-10-CM

## 2018-04-11 DIAGNOSIS — E559 Vitamin D deficiency, unspecified: Secondary | ICD-10-CM

## 2018-04-11 DIAGNOSIS — E8881 Metabolic syndrome: Secondary | ICD-10-CM

## 2018-04-11 DIAGNOSIS — Z6838 Body mass index (BMI) 38.0-38.9, adult: Secondary | ICD-10-CM

## 2018-04-11 MED ORDER — VITAMIN D (ERGOCALCIFEROL) 1.25 MG (50000 UNIT) PO CAPS: 50000.0000 [IU] | ORAL_CAPSULE | ORAL | 0 refills | Status: DC

## 2018-04-15 ENCOUNTER — Encounter (INDEPENDENT_AMBULATORY_CARE_PROVIDER_SITE_OTHER): Payer: Self-pay | Admitting: Family Medicine

## 2018-04-15 NOTE — Progress Notes (Signed)
Office: [phone]  /  Fax: [phone]   HPI:   Chief Complaint: OBESITY Judith [name] is here to discuss her progress with her obesity treatment plan. She is on the  follow the Category 2 plan and is following her eating plan approximately 60 % of the time. She states she is exercising 0 minutes 0 times per week. Judith [name] indulged on Thanksgiving, but gave away leftovers. She is eating all the food on the plan.  Her weight is 222 lb (100.7 kg) today and has had a weight loss of 5 pounds over a period of 2 weeks since her last visit. She has lost 31 lbs since starting treatment with [place].  Vitamin D deficiency Judith [name] has a diagnosis of vitamin D deficiency. She is currently taking vit D, but not at goal and denies nausea, vomiting or muscle weakness.  Insulin Resistance Judith [name] has a diagnosis of insulin resistance based on her elevated fasting insulin level >5. Although Judith [name]'s blood glucose readings are still under good control, insulin resistance puts her at greater risk of metabolic syndrome and diabetes. She is not taking metformin currently and continues to work on diet and exercise to decrease risk of diabetes. She admits to polyphagia.   At risk for osteopenia and osteoporosis Judith [name] is at higher risk of osteopenia and osteoporosis due to vitamin D deficiency.   ALLERGIES: No Known Allergies  MEDICATIONS: Current Outpatient Medications on File Prior to Visit  Medication Sig Dispense Refill  . Calcium-Magnesium-Vitamin D (CALCIUM 1200+D3 PO) Take 1 tablet by mouth daily. 1000U vit d    . Omega-3 Fatty Acids (FISH OIL) 1200 MG CAPS Take 1 capsule by mouth daily.    . vitamin C (ASCORBIC ACID) 500 MG tablet Take 500 mg by mouth daily.     No current facility-administered medications on file prior to visit.     PAST MEDICAL HISTORY: Past Medical History:  Diagnosis Date  . Obesity     PAST SURGICAL HISTORY: History reviewed. No pertinent surgical history.  SOCIAL  HISTORY: Social History   Tobacco Use  . Smoking status: Former Smoker    Packs/day: 0.20    Years: 5.00    Pack years: 1.00    Types: Cigarettes    Last attempt to quit: 09/09/1986    Years since quitting: 31.6  . Smokeless tobacco: Never Used  Substance Use Topics  . Alcohol use: Yes    Alcohol/week: 14.0 standard drinks    Types: 14 Glasses of wine per week  . Drug use: No    FAMILY HISTORY: Family History  Problem Relation Age of Onset  . Cancer Mother 85       lymphoma  . Cancer Father        renal 1969, 1999  . Arthritis Father     ROS: Review of Systems  Constitutional: Positive for weight loss.  Gastrointestinal: Negative for nausea and vomiting.  Musculoskeletal:       Negative for muscle weakness  Endo/Heme/Allergies:       Positive for polyphagia    PHYSICAL EXAM: Blood pressure (!) 91/58, pulse 61, temperature 97.8 F (36.6 C), temperature source Oral, height 5\' 4"  (1.626 m), weight 222 lb (100.7 kg), SpO2 96 %. Body mass index is 38.11 kg/m. Physical Exam  Constitutional: She is oriented to person, place, and time. She appears well-developed and well-nourished.  HENT:  Head: Normocephalic.  Eyes: Pupils are equal, round, and reactive to light.  Neck: Normal range of motion.  Cardiovascular: Normal rate.  Pulmonary/Chest: Effort normal.  Musculoskeletal: Normal range of motion.  Neurological: She is alert and oriented to person, place, and time.  Skin: Skin is warm and dry.  Psychiatric: She has a normal mood and affect. Her behavior is normal.  Vitals reviewed.   RECENT LABS AND TESTS: BMET    Component Value Date/Time   NA 142 10/12/2017 0946   K 4.8 10/12/2017 0946   CL 106 10/12/2017 0946   CO2 28 10/12/2017 0946   GLUCOSE 93 10/12/2017 0946   BUN 12 10/12/2017 0946   CREATININE 0.72 10/12/2017 0946   CALCIUM 9.7 10/12/2017 0946   GFRNONAA 95.00 12/29/2009 0943   Lab Results  Component Value Date   HGBA1C 5.2 10/12/2017    HGBA1C 5.2 01/18/2017   Lab Results  Component Value Date   INSULIN 10.0 01/09/2018   CBC    Component Value Date/Time   WBC 6.7 10/12/2017 0946   RBC 4.94 10/12/2017 0946   HGB 15.3 (H) 10/12/2017 0946   HCT 45.7 10/12/2017 0946   PLT 363.0 10/12/2017 0946   MCV 92.6 10/12/2017 0946   MCHC 33.5 10/12/2017 0946   RDW 13.6 10/12/2017 0946   LYMPHSABS 2.2 10/12/2017 0946   MONOABS 0.4 10/12/2017 0946   EOSABS 0.2 10/12/2017 0946   BASOSABS 0.0 10/12/2017 0946   Iron/TIBC/Ferritin/ %Sat No results found for: IRON, TIBC, FERRITIN, IRONPCTSAT Lipid Panel     Component Value Date/Time   CHOL 184 01/09/2018 1157   TRIG 76 01/09/2018 1157   HDL 50 01/09/2018 1157   CHOLHDL 4 10/12/2017 0946   VLDL 23.6 10/12/2017 0946   LDLCALC 119 (H) 01/09/2018 1157   Hepatic Function Panel     Component Value Date/Time   PROT 6.9 10/12/2017 0946   ALBUMIN 4.1 10/12/2017 0946   AST 18 10/12/2017 0946   ALT 23 10/12/2017 0946   ALKPHOS 103 10/12/2017 0946   BILITOT 0.4 10/12/2017 0946   BILIDIR 0.0 03/03/2013 1033      Component Value Date/Time   TSH 1.800 01/09/2018 1157   TSH 2.09 10/10/2016 0919   TSH 1.88 10/08/2015 1100    ASSESSMENT AND PLAN: Vitamin D deficiency - Plan: Vitamin D, Ergocalciferol, (DRISDOL) 1.25 MG (50000 UT) CAPS capsule  Insulin resistance  At risk for osteoporosis  Class 2 severe obesity with serious comorbidity and body mass index (BMI) of 38.0 to 38.9 in adult, unspecified obesity type ([place])  PLAN: Vitamin D Deficiency Judith [name] was informed that low vitamin D levels contributes to fatigue and are associated with obesity, breast, and colon cancer. She agrees to continue to take prescription Vit D @50 ,000 IU every week #4 with no refills and will follow up for routine testing of vitamin D, at least 2-3 times per year. She was informed of the risk of over-replacement of vitamin D and agrees to not increase her dose unless she discusses this with [place]  first. We will repeat lab at next visit.   Insulin Resistance Judith [name] will continue to work on weight loss, exercise, and decreasing simple carbohydrates in her diet to help decrease the risk of diabetes. We dicussed metformin including benefits and risks.  Judith [name] will continue meal plan. Judith [name] agreed to follow up with [place] as directed to monitor her progress.  At risk for osteopenia and osteoporosis Judith [name] was given extended  (15 minutes) osteoporosis prevention counseling today. Judith [name] is at risk for osteopenia and osteoporosis due to her vitamin D deficiency. She was encouraged to take her vitamin D and  follow her higher calcium diet and increase strengthening exercise to help strengthen her bones and decrease her risk of osteopenia and osteoporosis.  Obesity Judith [name] is currently in the action stage of change. As such, her goal is to continue with weight loss efforts She has agreed to follow the Category 2 plan Judith [name] has been instructed to work up to a goal of 150 minutes of combined cardio and strengthening exercise per week for weight loss and overall health benefits. We discussed the following Behavioral Modification Strategies today: holiday eating strategies and planning for success.    Judith [name] has agreed to follow up with our clinic in 2-3 weeks. She was informed of the importance of frequent follow up visits to maximize her success with intensive lifestyle modifications for her multiple health conditions.   OBESITY BEHAVIORAL INTERVENTION VISIT  Today's visit was # 7   Starting weight: 253 lb Starting date: 01/09/18 Today's weight : Weight: 222 lb (100.7 kg)  Today's date: 04/11/18 Total lbs lost to date: 31 lb    ASK: We discussed the diagnosis of obesity with Judith [name] today and Judith [name] agreed to give [place] permission to discuss obesity behavioral modification therapy today.  ASSESS: Judith [name] has the diagnosis of obesity and her BMI today is  38.09 Judith [name] is in the action stage of change   ADVISE: Judith [name] was educated on the multiple health risks of obesity as well as the benefit of weight loss to improve her health. She was advised of the need for long term treatment and the importance of lifestyle modifications to improve her current health and to decrease her risk of future health problems.  AGREE: Multiple dietary modification options and treatment options were discussed and  Judith [name] agreed to follow the recommendations documented in the above note.  ARRANGE: Judith [name] was educated on the importance of frequent visits to treat obesity as outlined per CMS and USPSTF guidelines and agreed to schedule her next follow up appointment today.  I, Renee Ramus, am acting as Location manager for [employer], FNP-C.  I have reviewed the above documentation for accuracy and completeness, and I agree with the above.  - Markita Stcharles, FNP-C.

## 2018-05-06 ENCOUNTER — Ambulatory Visit (INDEPENDENT_AMBULATORY_CARE_PROVIDER_SITE_OTHER): Payer: BLUE CROSS/BLUE SHIELD | Admitting: Physician Assistant

## 2018-05-06 ENCOUNTER — Encounter (INDEPENDENT_AMBULATORY_CARE_PROVIDER_SITE_OTHER): Payer: Self-pay | Admitting: Physician Assistant

## 2018-05-06 VITALS — BP 104/73 | HR 77 | Temp 98.1°F | Ht 64.0 in | Wt 215.0 lb

## 2018-05-06 DIAGNOSIS — E559 Vitamin D deficiency, unspecified: Secondary | ICD-10-CM

## 2018-05-06 DIAGNOSIS — E7849 Other hyperlipidemia: Secondary | ICD-10-CM | POA: Diagnosis not present

## 2018-05-06 DIAGNOSIS — Z9189 Other specified personal risk factors, not elsewhere classified: Secondary | ICD-10-CM | POA: Diagnosis not present

## 2018-05-06 DIAGNOSIS — E8881 Metabolic syndrome: Secondary | ICD-10-CM | POA: Diagnosis not present

## 2018-05-06 DIAGNOSIS — Z6837 Body mass index (BMI) 37.0-37.9, adult: Secondary | ICD-10-CM

## 2018-05-06 MED ORDER — VITAMIN D (ERGOCALCIFEROL) 1.25 MG (50000 UNIT) PO CAPS: 50000.0000 [IU] | ORAL_CAPSULE | ORAL | 0 refills | Status: DC

## 2018-05-06 NOTE — Progress Notes (Signed)
Office: [phone]  /  Fax: [phone]   HPI:   Chief Complaint: OBESITY Judith [name] is here to discuss her progress with her obesity treatment plan. She is on the Category 2 plan and is following her eating plan approximately 45 % of the time. She states she is exercising 0 minutes 0 times per week. Judith [name] did well with weight loss. She reports following the plan most days except for Christmas.  Her weight is 215 lb (97.5 kg) today and has had a weight loss of 7 pounds over a period of 3 to 4 weeks since her last visit. She has lost 38 lbs since starting treatment with [place].  Vitamin D Deficiency Judith [name] has a diagnosis of vitamin D deficiency. She is on prescription Vit D and denies nausea, vomiting or muscle weakness.  Hyperlipidemia Judith [name] has hyperlipidemia and has been trying to improve her cholesterol levels with intensive lifestyle modification including a low saturated fat diet, exercise and weight loss. She is on fish oil and denies any chest pain, claudication or myalgias.  At risk for cardiovascular disease Judith [name] is at a higher than average risk for cardiovascular disease due to obesity and hyperlipidemia. She currently denies any chest pain.  Insulin Resistance Judith [name] has a diagnosis of insulin resistance based on her elevated fasting insulin level >5. Although Judith [name]'s blood glucose readings are still under good control, insulin resistance puts her at greater risk of metabolic syndrome and diabetes. She is not taking metformin currently and continues to work on diet and exercise to decrease risk of diabetes. She denies polyphagia.  ALLERGIES: No Known Allergies  MEDICATIONS: Current Outpatient Medications on File Prior to Visit  Medication Sig Dispense Refill  . Calcium-Magnesium-Vitamin D (CALCIUM 1200+D3 PO) Take 1 tablet by mouth daily. 1000U vit d    . Omega-3 Fatty Acids (FISH OIL) 1200 MG CAPS Take 1 capsule by mouth daily.    . vitamin C (ASCORBIC ACID) 500  MG tablet Take 500 mg by mouth daily.     No current facility-administered medications on file prior to visit.     PAST MEDICAL HISTORY: Past Medical History:  Diagnosis Date  . Obesity     PAST SURGICAL HISTORY: History reviewed. No pertinent surgical history.  SOCIAL HISTORY: Social History   Tobacco Use  . Smoking status: Former Smoker    Packs/day: 0.20    Years: 5.00    Pack years: 1.00    Types: Cigarettes    Last attempt to quit: 09/09/1986    Years since quitting: 31.6  . Smokeless tobacco: Never Used  Substance Use Topics  . Alcohol use: Yes    Alcohol/week: 14.0 standard drinks    Types: 14 Glasses of wine per week  . Drug use: No    FAMILY HISTORY: Family History  Problem Relation Age of Onset  . Cancer Mother 50       lymphoma  . Cancer Father        renal 1969, 1999  . Arthritis Father     ROS: Review of Systems  Constitutional: Positive for weight loss.  Cardiovascular: Negative for chest pain and claudication.  Gastrointestinal: Negative for nausea and vomiting.  Musculoskeletal: Negative for myalgias.       Negative muscle weakness  Endo/Heme/Allergies:       Negative polyphagia    PHYSICAL EXAM: Blood pressure 104/73, pulse 77, temperature 98.1 F (36.7 C), temperature source Oral, height 5\' 4"  (1.626 m), weight 215 lb (97.5 kg), SpO2 93 %. Body  mass index is 36.9 kg/m. Physical Exam Vitals signs reviewed.  Constitutional:      Appearance: Normal appearance. She is obese.  Cardiovascular:     Rate and Rhythm: Normal rate.     Pulses: Normal pulses.  Pulmonary:     Effort: Pulmonary effort is normal.  Musculoskeletal: Normal range of motion.  Skin:    General: Skin is warm and dry.  Neurological:     Mental Status: She is alert and oriented to person, place, and time.  Psychiatric:        Mood and Affect: Mood normal.        Behavior: Behavior normal.     RECENT LABS AND TESTS: BMET    Component Value Date/Time   NA 142  10/12/2017 0946   K 4.8 10/12/2017 0946   CL 106 10/12/2017 0946   CO2 28 10/12/2017 0946   GLUCOSE 93 10/12/2017 0946   BUN 12 10/12/2017 0946   CREATININE 0.72 10/12/2017 0946   CALCIUM 9.7 10/12/2017 0946   GFRNONAA 95.00 12/29/2009 0943   Lab Results  Component Value Date   HGBA1C 5.2 10/12/2017   HGBA1C 5.2 01/18/2017   Lab Results  Component Value Date   INSULIN 10.0 01/09/2018   CBC    Component Value Date/Time   WBC 6.7 10/12/2017 0946   RBC 4.94 10/12/2017 0946   HGB 15.3 (H) 10/12/2017 0946   HCT 45.7 10/12/2017 0946   PLT 363.0 10/12/2017 0946   MCV 92.6 10/12/2017 0946   MCHC 33.5 10/12/2017 0946   RDW 13.6 10/12/2017 0946   LYMPHSABS 2.2 10/12/2017 0946   MONOABS 0.4 10/12/2017 0946   EOSABS 0.2 10/12/2017 0946   BASOSABS 0.0 10/12/2017 0946   Iron/TIBC/Ferritin/ %Sat No results found for: IRON, TIBC, FERRITIN, IRONPCTSAT Lipid Panel     Component Value Date/Time   CHOL 184 01/09/2018 1157   TRIG 76 01/09/2018 1157   HDL 50 01/09/2018 1157   CHOLHDL 4 10/12/2017 0946   VLDL 23.6 10/12/2017 0946   LDLCALC 119 (H) 01/09/2018 1157   Hepatic Function Panel     Component Value Date/Time   PROT 6.9 10/12/2017 0946   ALBUMIN 4.1 10/12/2017 0946   AST 18 10/12/2017 0946   ALT 23 10/12/2017 0946   ALKPHOS 103 10/12/2017 0946   BILITOT 0.4 10/12/2017 0946   BILIDIR 0.0 03/03/2013 1033      Component Value Date/Time   TSH 1.800 01/09/2018 1157   TSH 2.09 10/10/2016 0919   TSH 1.88 10/08/2015 1100    ASSESSMENT AND PLAN: Vitamin D deficiency - Plan: Vitamin D, Ergocalciferol, (DRISDOL) 1.25 MG (50000 UT) CAPS capsule  Other hyperlipidemia  Insulin resistance  At risk for heart disease  Class 2 severe obesity with serious comorbidity and body mass index (BMI) of 37.0 to 37.9 in adult, unspecified obesity type (HCC)  PLAN:  Vitamin D Deficiency Judith [name] was informed that low vitamin D levels contributes to fatigue and are associated with  obesity, breast, and colon cancer. Judith [name] agrees to continue taking prescription Vit D @50 ,000 IU every week #4 and we will refill for 1 month. She will follow up for routine testing of vitamin D, at least 2-3 times per year. She was informed of the risk of over-replacement of vitamin D and agrees to not increase her dose unless she discusses this with [place] first. We will check labs today and Judith [name] agrees to follow up with our clinic in 3 weeks.  Hyperlipidemia Judith [name] was informed of the  American Heart Association Guidelines emphasizing intensive lifestyle modifications as the first line treatment for hyperlipidemia. We discussed many lifestyle modifications today in depth, and Judith [name] will continue to work on decreasing saturated fats such as fatty red meat, butter and many fried foods. She will continue taking fish oil, and she will also increase vegetables and lean protein in her diet and continue to work on exercise and weight loss efforts. We will check labs today and Judith [name] agrees to follow up with our clinic in 3 weeks.  Cardiovascular risk counselling Judith [name] was given extended (15 minutes) coronary artery disease prevention counseling today. She is 62 y.o. female and has risk factors for heart disease including obesity and hyperlipidemia. We discussed intensive lifestyle modifications today with an emphasis on specific weight loss instructions and strategies. Pt was also informed of the importance of increasing exercise and decreasing saturated fats to help prevent heart disease.  Insulin Resistance Judith [name] will continue to work on weight loss, exercise, and decreasing simple carbohydrates in her diet to help decrease the risk of diabetes. We dicussed metformin including benefits and risks. She was informed that eating too many simple carbohydrates or too many calories at one sitting increases the likelihood of GI side effects. Judith [name] declined metformin for now and prescription was not written  today. We will check labs today. Judith [name] agrees to follow up with our clinic in 3 weeks as directed to monitor her progress.  Obesity Judith [name] is currently in the action stage of change. As such, her goal is to continue with weight loss efforts She has agreed to follow the Category 2 plan Judith [name] has been instructed to work up to a goal of 150 minutes of combined cardio and strengthening exercise per week for weight loss and overall health benefits. We discussed the following Behavioral Modification Strategies today: work on meal planning and easy cooking plans and ways to avoid boredom eating   Judith [name] has agreed to follow up with our clinic in 3 weeks. She was informed of the importance of frequent follow up visits to maximize her success with intensive lifestyle modifications for her multiple health conditions.   OBESITY BEHAVIORAL INTERVENTION VISIT  Today's visit was # 8  Starting weight: 253 lbs Starting date: 01/09/18 Today's weight : 215 lbs Today's date: 05/06/2018 Total lbs lost to date: 57    ASK: We discussed the diagnosis of obesity with Judith [name] today and Judith [name] agreed to give [place] permission to discuss obesity behavioral modification therapy today.  ASSESS: Judith [name] has the diagnosis of obesity and her BMI today is 36.89 Judith [name] is in the action stage of change   ADVISE: Breuna was educated on the multiple health risks of obesity as well as the benefit of weight loss to improve her health. She was advised of the need for long term treatment and the importance of lifestyle modifications.  AGREE: Multiple dietary modification options and treatment options were discussed and  Royale agreed to the above obesity treatment plan.  Wilhemena Durie, am acting as transcriptionist for Abby Potash, PA-C I, Abby Potash, PA-C have reviewed above note and agree with its content

## 2018-05-07 LAB — COMPREHENSIVE METABOLIC PANEL
ALK PHOS: 120 IU/L — AB (ref 39–117)
ALT: 23 IU/L (ref 0–32)
AST: 17 IU/L (ref 0–40)
Albumin/Globulin Ratio: 1.7 (ref 1.2–2.2)
Albumin: 4.1 g/dL (ref 3.6–4.8)
BUN/Creatinine Ratio: 24 (ref 12–28)
BUN: 18 mg/dL (ref 8–27)
Bilirubin Total: 0.3 mg/dL (ref 0.0–1.2)
CALCIUM: 9.6 mg/dL (ref 8.7–10.3)
CO2: 21 mmol/L (ref 20–29)
CREATININE: 0.74 mg/dL (ref 0.57–1.00)
Chloride: 107 mmol/L — ABNORMAL HIGH (ref 96–106)
GFR calc Af Amer: 102 mL/min/{1.73_m2} (ref >59)
GFR, EST NON AFRICAN AMERICAN: 88 mL/min/{1.73_m2} (ref >59)
GLOBULIN, TOTAL: 2.4 g/dL (ref 1.5–4.5)
Glucose: 88 mg/dL (ref 65–99)
Potassium: 4.8 mmol/L (ref 3.5–5.2)
SODIUM: 141 mmol/L (ref 134–144)
Total Protein: 6.5 g/dL (ref 6.0–8.5)

## 2018-05-07 LAB — HEMOGLOBIN A1C
ESTIMATED AVERAGE GLUCOSE: 103 mg/dL
HEMOGLOBIN A1C: 5.2 % (ref 4.8–5.6)

## 2018-05-07 LAB — LIPID PANEL WITH LDL/HDL RATIO
Cholesterol, Total: 176 mg/dL (ref 100–199)
HDL: 42 mg/dL (ref >39)
LDL Calculated: 117 mg/dL — ABNORMAL HIGH (ref 0–99)
LDL/HDL RATIO: 2.8 ratio (ref 0.0–3.2)
TRIGLYCERIDES: 85 mg/dL (ref 0–149)
VLDL Cholesterol Cal: 17 mg/dL (ref 5–40)

## 2018-05-07 LAB — VITAMIN D 25 HYDROXY (VIT D DEFICIENCY, FRACTURES): Vit D, 25-Hydroxy: 43.8 ng/mL (ref 30.0–100.0)

## 2018-05-07 LAB — INSULIN, RANDOM: INSULIN: 6.4 u[IU]/mL (ref 2.6–24.9)

## 2018-05-27 ENCOUNTER — Encounter (INDEPENDENT_AMBULATORY_CARE_PROVIDER_SITE_OTHER): Payer: Self-pay | Admitting: Physician Assistant

## 2018-05-27 ENCOUNTER — Ambulatory Visit (INDEPENDENT_AMBULATORY_CARE_PROVIDER_SITE_OTHER): Payer: BLUE CROSS/BLUE SHIELD | Admitting: Physician Assistant

## 2018-05-27 VITALS — BP 122/82 | HR 63 | Temp 97.5°F | Ht 64.0 in | Wt 212.0 lb

## 2018-05-27 DIAGNOSIS — E559 Vitamin D deficiency, unspecified: Secondary | ICD-10-CM | POA: Diagnosis not present

## 2018-05-27 DIAGNOSIS — Z6836 Body mass index (BMI) 36.0-36.9, adult: Secondary | ICD-10-CM

## 2018-05-27 DIAGNOSIS — Z9189 Other specified personal risk factors, not elsewhere classified: Secondary | ICD-10-CM

## 2018-05-27 DIAGNOSIS — E7849 Other hyperlipidemia: Secondary | ICD-10-CM

## 2018-05-27 MED ORDER — VITAMIN D (ERGOCALCIFEROL) 1.25 MG (50000 UNIT) PO CAPS
50000.0000 [IU] | ORAL_CAPSULE | ORAL | 0 refills | Status: DC
Start: 2018-05-27 — End: 2019-02-25

## 2018-05-27 NOTE — Progress Notes (Signed)
Office: [phone]  /  Fax: [phone]   HPI:   Chief Complaint: OBESITY Judith [name] is here to discuss her progress with her obesity treatment plan. She is on the Category 2 plan and is following her eating plan approximately 45 % of the time. She states she is exercising 0 minutes 0 times per week. Aliani did well with weight loss. She reports following the plan even when eating out. She is bored with vegetables.  Her weight is 212 lb (96.2 kg) today and has had a weight loss of 3 pounds over a period of 3 weeks since her last visit. She has lost 41 lbs since starting treatment with [place].  Vitamin D deficiency Judith [name] has a diagnosis of vitamin D deficiency. She is currently taking prescription Vit D and denies nausea, vomiting or muscle weakness.  Hyperlipidemia Judith [name] has hyperlipidemia and has been trying to improve her cholesterol levels with intensive lifestyle modification including a low saturated fat diet, exercise and weight loss. She is currently taking fish oil. She denies any chest pain.  At risk for osteopenia and osteoporosis Judith [name] is at higher risk of osteopenia and osteoporosis due to vitamin D deficiency.    ASSESSMENT AND PLAN:  Vitamin D deficiency - Plan: Vitamin D, Ergocalciferol, (DRISDOL) 1.25 MG (50000 UT) CAPS capsule  Other hyperlipidemia  At risk for osteoporosis  Class 2 severe obesity with serious comorbidity and body mass index (BMI) of 36.0 to 36.9 in adult, unspecified obesity type ([place])  PLAN:  Vitamin D Deficiency Judith [name] was informed that low vitamin D levels contributes to fatigue and are associated with obesity, breast, and colon cancer. She agrees to continue to take prescription Vit D @50 ,000 IU every week #12 with no refills and will follow up for routine testing of vitamin D, at least 2-3 times per year. She was informed of the risk of over-replacement of vitamin D and agrees to not increase her dose unless she discusses this with [place]  first. Shrita agrees to follow up with our clinic in 3 weeks.   Hyperlipidemia Judith [name] was informed of the American Heart Association Guidelines emphasizing intensive lifestyle modifications as the first line treatment for hyperlipidemia. We discussed many lifestyle modifications today in depth, and Judith [name] will continue to work on decreasing saturated fats such as fatty red meat, butter and many fried foods. She will also increase vegetables and lean protein in her diet and continue to work on exercise and weight loss efforts. Judith [name] agrees to follow up with our clinic in 3 weeks.   At risk for osteopenia and osteoporosis Judith [name] was given extended  (15 minutes) osteoporosis prevention counseling today. Judith [name] is at risk for osteopenia and osteoporosis due to her vitamin D deficiency. She was encouraged to take her vitamin D and follow her higher calcium diet and increase strengthening exercise to help strengthen her bones and decrease her risk of osteopenia and osteoporosis.  Obesity Judith [name] is currently in the action stage of change. As such, her goal is to continue with weight loss efforts She has agreed to follow the Category 2 plan Judith [name] has been instructed to work up to a goal of 150 minutes of combined cardio and strengthening exercise per week for weight loss and overall health benefits. We discussed the following Behavioral Modification Strategies today: work on meal planning and easy cooking plans and ways to avoid boredom eating  Judith [name] has agreed to follow up with our clinic in 3 weeks. She was informed of the importance of frequent  follow up visits to maximize her success with intensive lifestyle modifications for her multiple health conditions.  ALLERGIES: No Known Allergies  MEDICATIONS: Current Outpatient Medications on File Prior to Visit  Medication Sig Dispense Refill  . Calcium-Magnesium-Vitamin D (CALCIUM 1200+D3 PO) Take 1 tablet by mouth daily. 1000U vit d    .  Omega-3 Fatty Acids (FISH OIL) 1200 MG CAPS Take 1 capsule by mouth daily.    . vitamin C (ASCORBIC ACID) 500 MG tablet Take 500 mg by mouth daily.     No current facility-administered medications on file prior to visit.     PAST MEDICAL HISTORY: Past Medical History:  Diagnosis Date  . Obesity     PAST SURGICAL HISTORY: History reviewed. No pertinent surgical history.  SOCIAL HISTORY: Social History   Tobacco Use  . Smoking status: Former Smoker    Packs/day: 0.20    Years: 5.00    Pack years: 1.00    Types: Cigarettes    Last attempt to quit: 09/09/1986    Years since quitting: 31.7  . Smokeless tobacco: Never Used  Substance Use Topics  . Alcohol use: Yes    Alcohol/week: 14.0 standard drinks    Types: 14 Glasses of wine per week  . Drug use: No    FAMILY HISTORY: Family History  Problem Relation Age of Onset  . Cancer Mother 40       lymphoma  . Cancer Father        renal 1969, 1999  . Arthritis Father     ROS: Review of Systems  Constitutional: Positive for weight loss.  Cardiovascular: Negative for chest pain.  Gastrointestinal: Negative for nausea and vomiting.  Musculoskeletal:       Negative for muscle weakness    PHYSICAL EXAM: Blood pressure 122/82, pulse 63, temperature (!) 97.5 F (36.4 C), temperature source Oral, height 5\' 4"  (1.626 m), weight 212 lb (96.2 kg), SpO2 93 %. Body mass index is 36.39 kg/m. Physical Exam Vitals signs reviewed.  Constitutional:      Appearance: Normal appearance. She is obese.  Cardiovascular:     Rate and Rhythm: Normal rate.     Pulses: Normal pulses.  Pulmonary:     Effort: Pulmonary effort is normal.  Musculoskeletal: Normal range of motion.  Skin:    General: Skin is warm and dry.  Neurological:     Mental Status: She is alert and oriented to person, place, and time.  Psychiatric:        Mood and Affect: Mood normal.        Behavior: Behavior normal.     RECENT LABS AND TESTS: BMET      Component Value Date/Time   NA 141 05/06/2018 1016   K 4.8 05/06/2018 1016   CL 107 (H) 05/06/2018 1016   CO2 21 05/06/2018 1016   GLUCOSE 88 05/06/2018 1016   GLUCOSE 93 10/12/2017 0946   BUN 18 05/06/2018 1016   CREATININE 0.74 05/06/2018 1016   CALCIUM 9.6 05/06/2018 1016   GFRNONAA 88 05/06/2018 1016   GFRAA 102 05/06/2018 1016   Lab Results  Component Value Date   HGBA1C 5.2 05/06/2018   HGBA1C 5.2 10/12/2017   HGBA1C 5.2 01/18/2017   Lab Results  Component Value Date   INSULIN 6.4 05/06/2018   INSULIN 10.0 01/09/2018   CBC    Component Value Date/Time   WBC 6.7 10/12/2017 0946   RBC 4.94 10/12/2017 0946   HGB 15.3 (H) 10/12/2017 0946   HCT  45.7 10/12/2017 0946   PLT 363.0 10/12/2017 0946   MCV 92.6 10/12/2017 0946   MCHC 33.5 10/12/2017 0946   RDW 13.6 10/12/2017 0946   LYMPHSABS 2.2 10/12/2017 0946   MONOABS 0.4 10/12/2017 0946   EOSABS 0.2 10/12/2017 0946   BASOSABS 0.0 10/12/2017 0946   Iron/TIBC/Ferritin/ %Sat No results found for: IRON, TIBC, FERRITIN, IRONPCTSAT Lipid Panel     Component Value Date/Time   CHOL 176 05/06/2018 1016   TRIG 85 05/06/2018 1016   HDL 42 05/06/2018 1016   CHOLHDL 4 10/12/2017 0946   VLDL 23.6 10/12/2017 0946   LDLCALC 117 (H) 05/06/2018 1016   Hepatic Function Panel     Component Value Date/Time   PROT 6.5 05/06/2018 1016   ALBUMIN 4.1 05/06/2018 1016   AST 17 05/06/2018 1016   ALT 23 05/06/2018 1016   ALKPHOS 120 (H) 05/06/2018 1016   BILITOT 0.3 05/06/2018 1016   BILIDIR 0.0 03/03/2013 1033      Component Value Date/Time   TSH 1.800 01/09/2018 1157   TSH 2.09 10/10/2016 0919   TSH 1.88 10/08/2015 1100    Ref. Range 05/06/2018 10:16  Vitamin D, 25-Hydroxy Latest Ref Range: 30.0 - 100.0 ng/mL 43.8      OBESITY BEHAVIORAL INTERVENTION VISIT  Today's visit was # 9   Starting weight: 253 lbs Starting date: 01/09/2018 Today's weight :: 212 lbs Today's date: 05/27/2018 Total lbs lost to date:  22   ASK: We discussed the diagnosis of obesity with Caren Griffins [name] today and Carianna agreed to give [place] permission to discuss obesity behavioral modification therapy today.  ASSESS: Raygen has the diagnosis of obesity and her BMI today is 36.37 Kalkidan is in the action stage of change   ADVISE: Liliauna was educated on the multiple health risks of obesity as well as the benefit of weight loss to improve her health. She was advised of the need for long term treatment and the importance of lifestyle modifications to improve her current health and to decrease her risk of future health problems.  AGREE: Multiple dietary modification options and treatment options were discussed and  Lillan agreed to follow the recommendations documented in the above note.  ARRANGE: Silver was educated on the importance of frequent visits to treat obesity as outlined per CMS and USPSTF guidelines and agreed to schedule her next follow up appointment today.  I, Aleesia Henney, am acting as Location manager for [employer], PA-C I, Abby Potash, PA-C have reviewed above note and agree with its content

## 2018-06-10 ENCOUNTER — Telehealth (INDEPENDENT_AMBULATORY_CARE_PROVIDER_SITE_OTHER): Payer: Self-pay | Admitting: Physician Assistant

## 2018-06-13 NOTE — Telephone Encounter (Signed)
Prescription was sent in on 05/27/18. Judith [name], [name]

## 2018-06-17 ENCOUNTER — Ambulatory Visit (INDEPENDENT_AMBULATORY_CARE_PROVIDER_SITE_OTHER): Payer: BLUE CROSS/BLUE SHIELD | Admitting: Physician Assistant

## 2018-06-17 ENCOUNTER — Encounter (INDEPENDENT_AMBULATORY_CARE_PROVIDER_SITE_OTHER): Payer: Self-pay | Admitting: Physician Assistant

## 2018-06-17 VITALS — BP 109/74 | HR 65 | Ht 64.0 in | Wt 207.0 lb

## 2018-06-17 DIAGNOSIS — E559 Vitamin D deficiency, unspecified: Secondary | ICD-10-CM | POA: Diagnosis not present

## 2018-06-17 DIAGNOSIS — Z6835 Body mass index (BMI) 35.0-35.9, adult: Secondary | ICD-10-CM

## 2018-06-17 NOTE — Progress Notes (Signed)
Office: [phone]  /  Fax: [phone]   HPI:   Chief Complaint: OBESITY Judith [name] is here to discuss her progress with her obesity treatment plan. She is on the Category 2 plan and is following her eating plan approximately 62% of the time. She states she is exercising 0 minutes 0 times per week. Judith [name] did well with weight loss. She reports that she has been following the plan well even when eating out.  Her weight is 207 lb (93.9 kg) today and has had a weight loss of 5 pounds over a period of 3 weeks since her last visit. She has lost 46 lbs since starting treatment with [place].  Vitamin D deficiency Judith [name] has a diagnosis of Vitamin D deficiency. She is currently taking prescription Vit D and denies nausea, vomiting or diarrhea.  ASSESSMENT AND PLAN:  Vitamin D deficiency  Class 2 severe obesity with serious comorbidity and body mass index (BMI) of 35.0 to 35.9 in adult, unspecified obesity type (Judith [name])  PLAN:  Vitamin D Deficiency Judith [name] was informed that low Vitamin D levels contributes to fatigue and are associated with obesity, breast, and colon cancer. She agrees to continue to take prescription Vit D @ 50,000 IU every week and will follow up for routine testing of Vtamin D, at least 2-3 times per year. She was informed of the risk of over-replacement of Vitamin D and agrees to not increase her dose unless she discusses this with [place] first. Judith [name] agrees to follow-up with our clinic in 3 weeks.  I spent > than 50% of the 15 minute visit on counseling as documented in the note.  Obesity Judith [name] is currently in the action stage of change. As such, her goal is to continue with weight loss efforts. She has agreed to follow the Category 2 plan. Judith [name] has been instructed to work up to a goal of 150 minutes of combined cardio and strengthening exercise per week for weight loss and overall health benefits. We discussed the following Behavioral Modification Strategies today: work  on meal planning and easy cooking plans and ways to avoid boredom eating.  Judith [name] has agreed to follow up with our clinic in 3 weeks. She was informed of the importance of frequent follow up visits to maximize her success with intensive lifestyle modifications for her multiple health conditions.  ALLERGIES: No Known Allergies  MEDICATIONS: Current Outpatient Medications on File Prior to Visit  Medication Sig Dispense Refill  . Calcium-Magnesium-Vitamin D (CALCIUM 1200+D3 PO) Take 1 tablet by mouth daily. 1000U vit d    . Omega-3 Fatty Acids (FISH OIL) 1200 MG CAPS Take 1 capsule by mouth daily.    . vitamin C (ASCORBIC ACID) 500 MG tablet Take 500 mg by mouth daily.    . Vitamin D, Ergocalciferol, (DRISDOL) 1.25 MG (50000 UT) CAPS capsule Take 1 capsule (50,000 Units total) by mouth every 7 (seven) days. 12 capsule 0   No current facility-administered medications on file prior to visit.     PAST MEDICAL HISTORY: Past Medical History:  Diagnosis Date  . Obesity     PAST SURGICAL HISTORY: History reviewed. No pertinent surgical history.  SOCIAL HISTORY: Social History   Tobacco Use  . Smoking status: Former Smoker    Packs/day: 0.20    Years: 5.00    Pack years: 1.00    Types: Cigarettes    Last attempt to quit: 09/09/1986    Years since quitting: 31.7  . Smokeless tobacco: Never Used  Substance Use Topics  .  Alcohol use: Yes    Alcohol/week: 14.0 standard drinks    Types: 14 Glasses of wine per week  . Drug use: No    FAMILY HISTORY: Family History  Problem Relation Age of Onset  . Cancer Mother 67       lymphoma  . Cancer Father        renal 1969, 1999  . Arthritis Father    ROS: Review of Systems  Constitutional: Positive for weight loss.  Gastrointestinal: Negative for diarrhea, nausea and vomiting.  Endo/Heme/Allergies:       Negative for hypoglycemia.   PHYSICAL EXAM: Blood pressure 109/74, pulse 65, height 5\' 4"  (1.626 m), weight 207 lb (93.9 kg),  SpO2 98 %. Body mass index is 35.53 kg/m. Physical Exam Vitals signs reviewed.  Constitutional:      Appearance: Normal appearance. She is obese.  Cardiovascular:     Rate and Rhythm: Normal rate.     Pulses: Normal pulses.  Pulmonary:     Effort: Pulmonary effort is normal.     Breath sounds: Normal breath sounds.  Musculoskeletal: Normal range of motion.  Skin:    General: Skin is warm and dry.  Neurological:     Mental Status: She is alert and oriented to person, place, and time.  Psychiatric:        Behavior: Behavior normal.   RECENT LABS AND TESTS: BMET    Component Value Date/Time   NA 141 05/06/2018 1016   K 4.8 05/06/2018 1016   CL 107 (H) 05/06/2018 1016   CO2 21 05/06/2018 1016   GLUCOSE 88 05/06/2018 1016   GLUCOSE 93 10/12/2017 0946   BUN 18 05/06/2018 1016   CREATININE 0.74 05/06/2018 1016   CALCIUM 9.6 05/06/2018 1016   GFRNONAA 88 05/06/2018 1016   GFRAA 102 05/06/2018 1016   Lab Results  Component Value Date   HGBA1C 5.2 05/06/2018   HGBA1C 5.2 10/12/2017   HGBA1C 5.2 01/18/2017   Lab Results  Component Value Date   INSULIN 6.4 05/06/2018   INSULIN 10.0 01/09/2018   CBC    Component Value Date/Time   WBC 6.7 10/12/2017 0946   RBC 4.94 10/12/2017 0946   HGB 15.3 (H) 10/12/2017 0946   HCT 45.7 10/12/2017 0946   PLT 363.0 10/12/2017 0946   MCV 92.6 10/12/2017 0946   MCHC 33.5 10/12/2017 0946   RDW 13.6 10/12/2017 0946   LYMPHSABS 2.2 10/12/2017 0946   MONOABS 0.4 10/12/2017 0946   EOSABS 0.2 10/12/2017 0946   BASOSABS 0.0 10/12/2017 0946   Iron/TIBC/Ferritin/ %Sat No results found for: IRON, TIBC, FERRITIN, IRONPCTSAT Lipid Panel     Component Value Date/Time   CHOL 176 05/06/2018 1016   TRIG 85 05/06/2018 1016   HDL 42 05/06/2018 1016   CHOLHDL 4 10/12/2017 0946   VLDL 23.6 10/12/2017 0946   LDLCALC 117 (H) 05/06/2018 1016   Hepatic Function Panel     Component Value Date/Time   PROT 6.5 05/06/2018 1016   ALBUMIN 4.1  05/06/2018 1016   AST 17 05/06/2018 1016   ALT 23 05/06/2018 1016   ALKPHOS 120 (H) 05/06/2018 1016   BILITOT 0.3 05/06/2018 1016   BILIDIR 0.0 03/03/2013 1033      Component Value Date/Time   TSH 1.800 01/09/2018 1157   TSH 2.09 10/10/2016 0919   TSH 1.88 10/08/2015 1100   Results for [name], Judith "[name]" (MRN [id number]) as of 06/17/2018 14:22  Ref. Range 05/06/2018 10:16  Vitamin D, 25-Hydroxy Latest Ref Range:  30.0 - 100.0 ng/mL 43.8   OBESITY BEHAVIORAL INTERVENTION VISIT  Today's visit was #10  Starting weight: 253 lbs Starting date: 01/09/2018 Today's weight: 207 lbs Today's date: 06/17/2018 Total lbs lost to date: 30  ASK: We discussed the diagnosis of obesity with Caren Griffins [name] today and Amere agreed to give [place] permission to discuss obesity behavioral modification therapy today.  ASSESS: Taylynn has the diagnosis of obesity and her BMI today is @ 35.53. Lynita is in the action stage of change.  ADVISE: Minnie was educated on the multiple health risks of obesity as well as the benefit of weight loss to improve her health. She was advised of the need for long term treatment and the importance of lifestyle modifications to improve her current health and to decrease her risk of future health problems.  AGREE: Multiple dietary modification options and treatment options were discussed and  Skyann agreed to follow the recommendations documented in the above note.  ARRANGE: Marijah was educated on the importance of frequent visits to treat obesity as outlined per CMS and USPSTF guidelines and agreed to schedule her next follow up appointment today.  Migdalia Dk, am acting as transcriptionist for Abby Potash, PA-C I, Abby Potash, PA-C have reviewed above note and agree with its content

## 2018-06-21 ENCOUNTER — Encounter: Payer: Self-pay | Admitting: Family Medicine

## 2018-07-09 ENCOUNTER — Ambulatory Visit (INDEPENDENT_AMBULATORY_CARE_PROVIDER_SITE_OTHER): Payer: BLUE CROSS/BLUE SHIELD | Admitting: Physician Assistant

## 2018-07-09 ENCOUNTER — Encounter (INDEPENDENT_AMBULATORY_CARE_PROVIDER_SITE_OTHER): Payer: Self-pay

## 2018-07-10 ENCOUNTER — Ambulatory Visit (INDEPENDENT_AMBULATORY_CARE_PROVIDER_SITE_OTHER): Payer: BLUE CROSS/BLUE SHIELD | Admitting: Bariatrics

## 2018-07-10 ENCOUNTER — Encounter (INDEPENDENT_AMBULATORY_CARE_PROVIDER_SITE_OTHER): Payer: Self-pay | Admitting: Bariatrics

## 2018-07-10 VITALS — BP 109/68 | HR 65 | Temp 98.0°F | Ht 64.0 in | Wt 201.0 lb

## 2018-07-10 DIAGNOSIS — E559 Vitamin D deficiency, unspecified: Secondary | ICD-10-CM | POA: Diagnosis not present

## 2018-07-10 DIAGNOSIS — E669 Obesity, unspecified: Secondary | ICD-10-CM | POA: Diagnosis not present

## 2018-07-10 DIAGNOSIS — Z6834 Body mass index (BMI) 34.0-34.9, adult: Secondary | ICD-10-CM

## 2018-07-10 NOTE — Progress Notes (Signed)
Office: [phone]  /  Fax: [phone]   HPI:   Chief Complaint: OBESITY Judith [name] is here to discuss her progress with her obesity treatment plan. She is on the Category 2 plan and is following her eating plan approximately 50 % of the time. She states she is exercising 0 minutes 0 times per week. Judith [name] is doing well overall. She states that her bowels are moving less frequently. She denies constipation or incomplete evacuation. Her weight is 201 lb (91.2 kg) today and has had a weight loss of 6 pounds over a period of 3 weeks since her last visit. She has lost 52 lbs since starting treatment with [place].  Vitamin D deficiency Judith [name] has a diagnosis of vitamin D deficiency. She is currently taking high dose vit D and denies nausea, vomiting or muscle weakness.  ASSESSMENT AND PLAN:  Vitamin D deficiency  Class 1 obesity with serious comorbidity and body mass index (BMI) of 34.0 to 34.9 in adult, unspecified obesity type  PLAN:  Vitamin D Deficiency [name] was informed that low vitamin D levels contributes to fatigue and are associated with obesity, breast, and colon cancer. She agrees to continue to take prescription Vit D @50 ,000 IU every week and will follow up for routine testing of vitamin D, at least 2-3 times per year. She was informed of the risk of over-replacement of vitamin D and agrees to not increase her dose unless she discusses this with [place] first.  I spent > than 50% of the 15 minute visit on counseling as documented in the note.  Obesity Judith [name] is currently in the action stage of change. As such, her goal is to continue with weight loss efforts She has agreed to follow the Category 2 plan. [name] has been instructed to work up to a goal of 150 minutes of combined cardio and strengthening exercise per week for weight loss and overall health benefits. We discussed the following Behavioral Modification Strategies today: increasing lean protein intake, decreasing  simple carbohydrates, increasing vegetables, increase H2O intake, decreasing eating out, no skipping meals, meal planning and cooking strategies, keeping healthy foods in the home, increase high fiber foods, increase raw vegetables and making smarter fruit choices  Judith [name] has agreed to follow up with our clinic in 2 weeks. She was informed of the importance of frequent follow up visits to maximize her success with intensive lifestyle modifications for her multiple health conditions.  ALLERGIES: No Known Allergies  MEDICATIONS: Current Outpatient Medications on File Prior to Visit  Medication Sig Dispense Refill   Calcium-Magnesium-Vitamin D (CALCIUM 1200+D3 PO) Take 1 tablet by mouth daily. 1000U vit d     Omega-3 Fatty Acids (FISH OIL) 1200 MG CAPS Take 1 capsule by mouth daily.     vitamin C (ASCORBIC ACID) 500 MG tablet Take 500 mg by mouth daily.     Vitamin D, Ergocalciferol, (DRISDOL) 1.25 MG (50000 UT) CAPS capsule Take 1 capsule (50,000 Units total) by mouth every 7 (seven) days. 12 capsule 0   No current facility-administered medications on file prior to visit.     PAST MEDICAL HISTORY: Past Medical History:  Diagnosis Date   Obesity     PAST SURGICAL HISTORY: History reviewed. No pertinent surgical history.  SOCIAL HISTORY: Social History   Tobacco Use   Smoking status: Former Smoker    Packs/day: 0.20    Years: 5.00    Pack years: 1.00    Types: Cigarettes    Last attempt to quit: 09/09/1986  Years since quitting: 31.8   Smokeless tobacco: Never Used  Substance Use Topics   Alcohol use: Yes    Alcohol/week: 14.0 standard drinks    Types: 14 Glasses of wine per week   Drug use: No    FAMILY HISTORY: Family History  Problem Relation Age of Onset   Cancer Mother 25       lymphoma   Cancer Father        renal 1969, 1999   Arthritis Father     ROS: Review of Systems  Constitutional: Positive for weight loss.  Gastrointestinal: Negative for  nausea and vomiting.  Musculoskeletal:       Negative for muscle weakness    PHYSICAL EXAM: Blood pressure 109/68, pulse 65, temperature 98 F (36.7 C), temperature source Oral, height 5\' 4"  (1.626 m), weight 201 lb (91.2 kg), SpO2 98 %. Body mass index is 34.5 kg/m. Physical Exam Vitals signs reviewed.  Constitutional:      Appearance: Normal appearance. She is well-developed. She is obese.  Cardiovascular:     Rate and Rhythm: Normal rate.  Pulmonary:     Effort: Pulmonary effort is normal.  Musculoskeletal: Normal range of motion.  Skin:    General: Skin is warm and dry.  Neurological:     Mental Status: She is alert and oriented to person, place, and time.  Psychiatric:        Mood and Affect: Mood normal.        Behavior: Behavior normal.     RECENT LABS AND TESTS: BMET    Component Value Date/Time   NA 141 05/06/2018 1016   K 4.8 05/06/2018 1016   CL 107 (H) 05/06/2018 1016   CO2 21 05/06/2018 1016   GLUCOSE 88 05/06/2018 1016   GLUCOSE 93 10/12/2017 0946   BUN 18 05/06/2018 1016   CREATININE 0.74 05/06/2018 1016   CALCIUM 9.6 05/06/2018 1016   GFRNONAA 88 05/06/2018 1016   GFRAA 102 05/06/2018 1016   Lab Results  Component Value Date   HGBA1C 5.2 05/06/2018   HGBA1C 5.2 10/12/2017   HGBA1C 5.2 01/18/2017   Lab Results  Component Value Date   INSULIN 6.4 05/06/2018   INSULIN 10.0 01/09/2018   CBC    Component Value Date/Time   WBC 6.7 10/12/2017 0946   RBC 4.94 10/12/2017 0946   HGB 15.3 (H) 10/12/2017 0946   HCT 45.7 10/12/2017 0946   PLT 363.0 10/12/2017 0946   MCV 92.6 10/12/2017 0946   MCHC 33.5 10/12/2017 0946   RDW 13.6 10/12/2017 0946   LYMPHSABS 2.2 10/12/2017 0946   MONOABS 0.4 10/12/2017 0946   EOSABS 0.2 10/12/2017 0946   BASOSABS 0.0 10/12/2017 0946   Iron/TIBC/Ferritin/ %Sat No results found for: IRON, TIBC, FERRITIN, IRONPCTSAT Lipid Panel     Component Value Date/Time   CHOL 176 05/06/2018 1016   TRIG 85 05/06/2018  1016   HDL 42 05/06/2018 1016   CHOLHDL 4 10/12/2017 0946   VLDL 23.6 10/12/2017 0946   LDLCALC 117 (H) 05/06/2018 1016   Hepatic Function Panel     Component Value Date/Time   PROT 6.5 05/06/2018 1016   ALBUMIN 4.1 05/06/2018 1016   AST 17 05/06/2018 1016   ALT 23 05/06/2018 1016   ALKPHOS 120 (H) 05/06/2018 1016   BILITOT 0.3 05/06/2018 1016   BILIDIR 0.0 03/03/2013 1033      Component Value Date/Time   TSH 1.800 01/09/2018 1157   TSH 2.09 10/10/2016 0919   TSH 1.88  10/08/2015 1100   Results for [name], Shanera "[name]" (MRN [id number]) as of 07/10/2018 20:10  Ref. Range 05/06/2018 10:16  Vitamin D, 25-Hydroxy Latest Ref Range: 30.0 - 100.0 ng/mL 43.8     OBESITY BEHAVIORAL INTERVENTION VISIT  Today's visit was # 11   Starting weight: 253 lbs Starting date: 01/09/2018 Today's weight : 201 lbs  Today's date: 07/10/2018 Total lbs lost to date: 52    07/10/2018  Height 5\' 4"  (1.626 m)  Weight 201 lb (91.2 kg)  BMI (Calculated) 34.48  BLOOD PRESSURE - SYSTOLIC 425  BLOOD PRESSURE - DIASTOLIC 68   Body Fat % 95.6 %  Total Body Water (lbs) 77.4 lbs    ASK: We discussed the diagnosis of obesity with Caren Griffins [name] today and Sharhonda agreed to give [place] permission to discuss obesity behavioral modification therapy today.  ASSESS: Aristea has the diagnosis of obesity and her BMI today is 34.48 Camera is in the action stage of change   ADVISE: Dimond was educated on the multiple health risks of obesity as well as the benefit of weight loss to improve her health. She was advised of the need for long term treatment and the importance of lifestyle modifications to improve her current health and to decrease her risk of future health problems.  AGREE: Multiple dietary modification options and treatment options were discussed and  Emmory agreed to follow the recommendations documented in the above note.  ARRANGE: Emiyah was educated on the importance of  frequent visits to treat obesity as outlined per CMS and USPSTF guidelines and agreed to schedule her next follow up appointment today.  Corey Skains, am acting as Location manager for [employer]. Owens Shark, DO  I have reviewed the above documentation for accuracy and completeness, and I agree with the above. -Jearld Lesch, DO

## 2018-07-11 DIAGNOSIS — E669 Obesity, unspecified: Secondary | ICD-10-CM | POA: Insufficient documentation

## 2018-07-11 DIAGNOSIS — E559 Vitamin D deficiency, unspecified: Secondary | ICD-10-CM | POA: Insufficient documentation

## 2018-07-11 DIAGNOSIS — Z6834 Body mass index (BMI) 34.0-34.9, adult: Secondary | ICD-10-CM

## 2018-07-11 DIAGNOSIS — E663 Overweight: Secondary | ICD-10-CM | POA: Insufficient documentation

## 2018-07-31 ENCOUNTER — Encounter (INDEPENDENT_AMBULATORY_CARE_PROVIDER_SITE_OTHER): Payer: Self-pay

## 2018-07-31 ENCOUNTER — Ambulatory Visit (INDEPENDENT_AMBULATORY_CARE_PROVIDER_SITE_OTHER): Payer: BLUE CROSS/BLUE SHIELD | Admitting: Physician Assistant

## 2018-08-01 ENCOUNTER — Encounter (INDEPENDENT_AMBULATORY_CARE_PROVIDER_SITE_OTHER): Payer: Self-pay

## 2018-10-15 ENCOUNTER — Encounter (INDEPENDENT_AMBULATORY_CARE_PROVIDER_SITE_OTHER): Payer: Self-pay | Admitting: Family Medicine

## 2018-10-16 ENCOUNTER — Ambulatory Visit (INDEPENDENT_AMBULATORY_CARE_PROVIDER_SITE_OTHER): Payer: BLUE CROSS/BLUE SHIELD | Admitting: Family Medicine

## 2018-10-18 ENCOUNTER — Encounter: Payer: BLUE CROSS/BLUE SHIELD | Admitting: Family Medicine

## 2018-10-28 ENCOUNTER — Ambulatory Visit (INDEPENDENT_AMBULATORY_CARE_PROVIDER_SITE_OTHER): Payer: BC Managed Care – PPO | Admitting: Family Medicine

## 2018-10-28 ENCOUNTER — Encounter (INDEPENDENT_AMBULATORY_CARE_PROVIDER_SITE_OTHER): Payer: Self-pay | Admitting: Family Medicine

## 2018-10-28 ENCOUNTER — Other Ambulatory Visit: Payer: Self-pay

## 2018-10-28 DIAGNOSIS — E669 Obesity, unspecified: Secondary | ICD-10-CM | POA: Diagnosis not present

## 2018-10-28 DIAGNOSIS — E559 Vitamin D deficiency, unspecified: Secondary | ICD-10-CM | POA: Diagnosis not present

## 2018-10-28 DIAGNOSIS — Z6834 Body mass index (BMI) 34.0-34.9, adult: Secondary | ICD-10-CM

## 2018-10-28 DIAGNOSIS — E7849 Other hyperlipidemia: Secondary | ICD-10-CM

## 2018-10-28 DIAGNOSIS — E8881 Metabolic syndrome: Secondary | ICD-10-CM | POA: Diagnosis not present

## 2018-10-29 ENCOUNTER — Encounter (INDEPENDENT_AMBULATORY_CARE_PROVIDER_SITE_OTHER): Payer: Self-pay | Admitting: Family Medicine

## 2018-10-29 NOTE — Progress Notes (Signed)
Office: [phone]  /  Fax: [phone] TeleHealth Visit:  Judith [name] has verbally consented to this TeleHealth visit today. The patient is located at work, the provider is located at the [employer] [facility] office. The participants in this visit include the listed provider and patient. The visit was conducted today via Webex.  HPI:   Chief Complaint: OBESITY Judith [name] is here to discuss her progress with her obesity treatment plan. She is on the Category 2 plan and is following her eating plan approximately 60% of the time. She states she is exercising 0 minutes 0 times per week. Deziya reports a weight of 184 lbs at home today. She weighed 201 lbs at her last in office visit on 07/10/2018 and is down 17 lbs from her last office visit. She is doing very well on the plan. We were unable to weigh the patient today for this TeleHealth visit. She feels as if she has lost weight since her last visit. She has lost 52 lbs since starting treatment with [place].  Vitamin D deficiency Judith [name] has a diagnosis of Vitamin D deficiency, which is not at goal. Her last Vitamin D level was reported to be 43.8 on 05/06/2018. She has been off of Vitamin D since April. She denies nausea, vomiting or muscle weakness.  Insulin Resistance Judith [name] has a diagnosis of insulin resistance based on her elevated fasting insulin level >5. Although Judith [name]'s blood glucose readings are still under good control, insulin resistance puts her at greater risk of metabolic syndrome and diabetes. She is not taking metformin currently and continues to work on diet and exercise to decrease risk of diabetes. She denies polyphagia but reports cravings occasionally. She is managing cravings by drinking water and keeping healthy foods to snack on. She declined bupropion.  Hyperlipidemia Judith [name] has hyperlipidemia and is not on a statin. Her last LDL was 117 on 05/06/2018, triglycerides within normal limits, and HDL low  at 42.Marland Kitchen She has been trying to improve her cholesterol levels with intensive lifestyle modification including a low saturated fat diet, exercise and weight loss. She denies any chest pain or shortness of breath. The 10-year ASCVD risk score Judith Bussing DC Jr., et al., 2013) is: 2.6%   Values used to calculate the score:     Age: 63 years     Sex: Female     Is Non-Hispanic African American: No     Diabetic: No     Tobacco smoker: No     Systolic Blood Pressure: 301 mmHg     Is BP treated: No     HDL Cholesterol: 42 mg/dL     Total Cholesterol: 176 mg/dL   ASSESSMENT AND PLAN:  Vitamin D deficiency - Plan: VITAMIN D 25 Hydroxy (Vit-D Deficiency, Fractures)  Insulin resistance - Plan: Hemoglobin A1c, Insulin, random  Other hyperlipidemia - Plan: Comprehensive metabolic panel, Hemoglobin A1c, Lipid Panel With LDL/HDL Ratio  Class 1 obesity with serious comorbidity and body mass index (BMI) of 34.0 to 34.9 in adult, unspecified obesity type  PLAN:  Vitamin D Deficiency Casia was informed that low Vitamin D levels contributes to fatigue and are associated with obesity, breast, and colon cancer. She will have routine testing of Vitamin D. She was informed of the risk of over-replacement of Vitamin D. Judith [name] agrees to follow-up with our clinic in 2 weeks.  Insulin Resistance Judith [name] will continue to work on weight loss, exercise, and decreasing simple carbohydrates in her diet to help decrease the risk of diabetes. We  dicussed metformin including benefits and risks. She was informed that eating too many simple carbohydrates or too many calories at one sitting increases the likelihood of GI side effects. Judith [name] will have fasting glucose/insulin and A1c levels checked. She will follow-up with [place] as directed to monitor her progress.  Hyperlipidemia Judith [name] was informed of the American Heart Association Guidelines emphasizing intensive lifestyle modifications as the first line treatment for  hyperlipidemia. We discussed many lifestyle modifications today in depth, and Judith [name] will continue to work on decreasing saturated fats such as fatty red meat, butter and many fried foods. She will also increase vegetables and lean protein in her diet and continue to work on exercise and weight loss efforts. Noble will have fasting lipid panel checked.  Obesity Judith [name] is currently in the action stage of change. As such, her goal is to continue with weight loss efforts. She has agreed to follow the Category 2 plan. Judith [name] will go to [facility] to have her labs drawn. Judith [name] has been instructed to start walking for 20-30 minutes 2 times per week and build up to 150 minutes a week for weight loss and overall health benefits. We discussed the following Behavioral Modification Strategies today: keeping healthy foods in the home and planning for success.  Judith [name] has agreed to follow up with our clinic in 2 weeks. She was informed of the importance of frequent follow up visits to maximize her success with intensive lifestyle modifications for her multiple health conditions.  ALLERGIES: No Known Allergies  MEDICATIONS: Current Outpatient Medications on File Prior to Visit  Medication Sig Dispense Refill  . Calcium-Magnesium-Vitamin D (CALCIUM 1200+D3 PO) Take 1 tablet by mouth daily. 1000U vit d    . Omega-3 Fatty Acids (FISH OIL) 1200 MG CAPS Take 1 capsule by mouth daily.    . vitamin C (ASCORBIC ACID) 500 MG tablet Take 500 mg by mouth daily.    . Vitamin D, Ergocalciferol, (DRISDOL) 1.25 MG (50000 UT) CAPS capsule Take 1 capsule (50,000 Units total) by mouth every 7 (seven) days. 12 capsule 0   No current facility-administered medications on file prior to visit.     PAST MEDICAL HISTORY: Past Medical History:  Diagnosis Date  . Obesity     PAST SURGICAL HISTORY: History reviewed. No pertinent surgical history.  SOCIAL HISTORY: Social History   Tobacco Use  . Smoking status:  Former Smoker    Packs/day: 0.20    Years: 5.00    Pack years: 1.00    Types: Cigarettes    Quit date: 09/09/1986    Years since quitting: 32.1  . Smokeless tobacco: Never Used  Substance Use Topics  . Alcohol use: Yes    Alcohol/week: 14.0 standard drinks    Types: 14 Glasses of wine per week  . Drug use: No    FAMILY HISTORY: Family History  Problem Relation Age of Onset  . Cancer Mother 81       lymphoma  . Cancer Father        renal 1969, 1999  . Arthritis Father    ROS: Review of Systems  Respiratory: Negative for shortness of breath.   Cardiovascular: Negative for chest pain.  Gastrointestinal: Negative for nausea and vomiting.  Musculoskeletal:       Negative for muscle weakness.  Endo/Heme/Allergies:       Negative for polyphagia.   PHYSICAL EXAM: Pt in no acute distress  RECENT LABS AND TESTS: BMET    Component Value Date/Time   NA 141 05/06/2018  1016   K 4.8 05/06/2018 1016   CL 107 (H) 05/06/2018 1016   CO2 21 05/06/2018 1016   GLUCOSE 88 05/06/2018 1016   GLUCOSE 93 10/12/2017 0946   BUN 18 05/06/2018 1016   CREATININE 0.74 05/06/2018 1016   CALCIUM 9.6 05/06/2018 1016   GFRNONAA 88 05/06/2018 1016   GFRAA 102 05/06/2018 1016   Lab Results  Component Value Date   HGBA1C 5.2 05/06/2018   HGBA1C 5.2 10/12/2017   HGBA1C 5.2 01/18/2017   Lab Results  Component Value Date   INSULIN 6.4 05/06/2018   INSULIN 10.0 01/09/2018   CBC    Component Value Date/Time   WBC 6.7 10/12/2017 0946   RBC 4.94 10/12/2017 0946   HGB 15.3 (H) 10/12/2017 0946   HCT 45.7 10/12/2017 0946   PLT 363.0 10/12/2017 0946   MCV 92.6 10/12/2017 0946   MCHC 33.5 10/12/2017 0946   RDW 13.6 10/12/2017 0946   LYMPHSABS 2.2 10/12/2017 0946   MONOABS 0.4 10/12/2017 0946   EOSABS 0.2 10/12/2017 0946   BASOSABS 0.0 10/12/2017 0946   Iron/TIBC/Ferritin/ %Sat No results found for: IRON, TIBC, FERRITIN, IRONPCTSAT Lipid Panel     Component Value Date/Time   CHOL 176  05/06/2018 1016   TRIG 85 05/06/2018 1016   HDL 42 05/06/2018 1016   CHOLHDL 4 10/12/2017 0946   VLDL 23.6 10/12/2017 0946   LDLCALC 117 (H) 05/06/2018 1016   Hepatic Function Panel     Component Value Date/Time   PROT 6.5 05/06/2018 1016   ALBUMIN 4.1 05/06/2018 1016   AST 17 05/06/2018 1016   ALT 23 05/06/2018 1016   ALKPHOS 120 (H) 05/06/2018 1016   BILITOT 0.3 05/06/2018 1016   BILIDIR 0.0 03/03/2013 1033      Component Value Date/Time   TSH 1.800 01/09/2018 1157   TSH 2.09 10/10/2016 0919   TSH 1.88 10/08/2015 1100   Results for [name], Jazelle "[name]" (MRN [id number]) as of 10/29/2018 09:08  Ref. Range 05/06/2018 10:16  Vitamin D, 25-Hydroxy Latest Ref Range: 30.0 - 100.0 ng/mL 43.8    I, Michaelene Song, am acting as Location manager for [employer], FNP  I have reviewed the above documentation for accuracy and completeness, and I agree with the above.  - Bobette Leyh, FNP-C.

## 2018-10-31 ENCOUNTER — Encounter (INDEPENDENT_AMBULATORY_CARE_PROVIDER_SITE_OTHER): Payer: Self-pay | Admitting: Family Medicine

## 2018-10-31 DIAGNOSIS — E88819 Insulin resistance, unspecified: Secondary | ICD-10-CM | POA: Insufficient documentation

## 2018-10-31 DIAGNOSIS — E8881 Metabolic syndrome: Secondary | ICD-10-CM | POA: Insufficient documentation

## 2018-10-31 DIAGNOSIS — E7849 Other hyperlipidemia: Secondary | ICD-10-CM | POA: Insufficient documentation

## 2018-11-01 DIAGNOSIS — E8881 Metabolic syndrome: Secondary | ICD-10-CM | POA: Diagnosis not present

## 2018-11-01 DIAGNOSIS — E559 Vitamin D deficiency, unspecified: Secondary | ICD-10-CM | POA: Diagnosis not present

## 2018-11-01 DIAGNOSIS — E7849 Other hyperlipidemia: Secondary | ICD-10-CM | POA: Diagnosis not present

## 2018-11-02 LAB — LIPID PANEL WITH LDL/HDL RATIO
Cholesterol, Total: 150 mg/dL (ref 100–199)
HDL: 42 mg/dL (ref >39)
LDL Calculated: 89 mg/dL (ref 0–99)
LDl/HDL Ratio: 2.1 ratio (ref 0.0–3.2)
Triglycerides: 94 mg/dL (ref 0–149)
VLDL Cholesterol Cal: 19 mg/dL (ref 5–40)

## 2018-11-02 LAB — HEMOGLOBIN A1C
Est. average glucose Bld gHb Est-mCnc: 100 mg/dL
Hgb A1c MFr Bld: 5.1 % (ref 4.8–5.6)

## 2018-11-02 LAB — COMPREHENSIVE METABOLIC PANEL
ALT: 15 IU/L (ref 0–32)
AST: 14 IU/L (ref 0–40)
Albumin/Globulin Ratio: 1.9 (ref 1.2–2.2)
Albumin: 4.2 g/dL (ref 3.8–4.9)
Alkaline Phosphatase: 103 IU/L (ref 39–117)
BUN/Creatinine Ratio: 21 (ref 12–28)
BUN: 14 mg/dL (ref 8–27)
Bilirubin Total: 0.3 mg/dL (ref 0.0–1.2)
CO2: 23 mmol/L (ref 20–29)
Calcium: 9.4 mg/dL (ref 8.7–10.3)
Chloride: 105 mmol/L (ref 96–106)
Creatinine, Ser: 0.66 mg/dL (ref 0.57–1.00)
GFR calc Af Amer: 111 mL/min/{1.73_m2} (ref >59)
GFR calc non Af Amer: 96 mL/min/{1.73_m2} (ref >59)
Globulin, Total: 2.2 g/dL (ref 1.5–4.5)
Glucose: 81 mg/dL (ref 65–99)
Potassium: 4.6 mmol/L (ref 3.5–5.2)
Sodium: 142 mmol/L (ref 134–144)
Total Protein: 6.4 g/dL (ref 6.0–8.5)

## 2018-11-02 LAB — INSULIN, RANDOM: INSULIN: 3.4 u[IU]/mL (ref 2.6–24.9)

## 2018-11-02 LAB — VITAMIN D 25 HYDROXY (VIT D DEFICIENCY, FRACTURES): Vit D, 25-Hydroxy: 35.8 ng/mL (ref 30.0–100.0)

## 2018-11-11 ENCOUNTER — Encounter (INDEPENDENT_AMBULATORY_CARE_PROVIDER_SITE_OTHER): Payer: Self-pay | Admitting: Physician Assistant

## 2018-11-13 ENCOUNTER — Ambulatory Visit (INDEPENDENT_AMBULATORY_CARE_PROVIDER_SITE_OTHER): Payer: BC Managed Care – PPO | Admitting: Physician Assistant

## 2018-11-28 ENCOUNTER — Encounter (INDEPENDENT_AMBULATORY_CARE_PROVIDER_SITE_OTHER): Payer: Self-pay | Admitting: Physician Assistant

## 2018-11-28 ENCOUNTER — Other Ambulatory Visit: Payer: Self-pay

## 2018-11-28 ENCOUNTER — Ambulatory Visit (INDEPENDENT_AMBULATORY_CARE_PROVIDER_SITE_OTHER): Payer: BC Managed Care – PPO | Admitting: Physician Assistant

## 2018-11-28 VITALS — BP 117/74 | HR 58 | Temp 98.4°F | Ht 64.0 in | Wt 178.0 lb

## 2018-11-28 DIAGNOSIS — Z6830 Body mass index (BMI) 30.0-30.9, adult: Secondary | ICD-10-CM | POA: Diagnosis not present

## 2018-11-28 DIAGNOSIS — E559 Vitamin D deficiency, unspecified: Secondary | ICD-10-CM | POA: Diagnosis not present

## 2018-11-28 DIAGNOSIS — E669 Obesity, unspecified: Secondary | ICD-10-CM

## 2018-12-02 NOTE — Progress Notes (Signed)
Office: [phone]  /  Fax: [phone]   HPI:   Chief Complaint: OBESITY Judith [name] is here to discuss her progress with her obesity treatment plan. She is on the Category 2 plan and is following her eating plan approximately 85 % of the time. She states she is exercising 0 minutes 0 times per week. Judith [name] has done very well with the plan overall. She is getting in all of the food daily. She does not want to exercise.  Her weight is 178 lb (80.7 kg) today and has had a weight loss of 23 pounds over a period of 4 to 5 months since her last visit. She has lost 75 lbs since starting treatment with [place].  Vitamin D Deficiency Judith [name] has a diagnosis of vitamin D deficiency. She is not currently taking Vit D and denies nausea, vomiting or muscle weakness.  ASSESSMENT AND PLAN:  Vitamin D deficiency  Class 1 obesity with serious comorbidity and body mass index (BMI) of 30.0 to 30.9 in adult, unspecified obesity type  PLAN:  Vitamin D Deficiency Judith [name] was informed that low vitamin D levels contributes to fatigue and are associated with obesity, breast, and colon cancer. Judith [name] agrees to start taking OTC Vit D 5,000 IU daily and will follow up for routine testing of vitamin D, at least 2-3 times per year. She was informed of the risk of over-replacement of vitamin D and agrees to not increase her dose unless she discusses this with [place] first. Judith [name] agrees to follow up with our clinic in 3 to 4 weeks.  I spent > than 50% of the 15 minute visit on counseling as documented in the note.  Obesity Judith [name] is currently in the action stage of change. As such, her goal is to continue with weight loss efforts She has agreed to follow the Category 2 plan Judith [name] has been instructed to work up to a goal of 150 minutes of combined cardio and strengthening exercise per week for weight loss and overall health benefits. We discussed the following Behavioral Modification Strategies today: work on meal  planning and easy cooking plans and keeping healthy foods in the home   Judith [name] has agreed to follow up with our clinic in 3 to 4 weeks. She was informed of the importance of frequent follow up visits to maximize her success with intensive lifestyle modifications for her multiple health conditions.  ALLERGIES: No Known Allergies  MEDICATIONS: Current Outpatient Medications on File Prior to Visit  Medication Sig Dispense Refill  . Calcium-Magnesium-Vitamin D (CALCIUM 1200+D3 PO) Take 1 tablet by mouth daily. 1000U vit d    . Omega-3 Fatty Acids (FISH OIL) 1200 MG CAPS Take 1 capsule by mouth daily.    . vitamin C (ASCORBIC ACID) 500 MG tablet Take 500 mg by mouth daily.    . Vitamin D, Ergocalciferol, (DRISDOL) 1.25 MG (50000 UT) CAPS capsule Take 1 capsule (50,000 Units total) by mouth every 7 (seven) days. 12 capsule 0   No current facility-administered medications on file prior to visit.     PAST MEDICAL HISTORY: Past Medical History:  Diagnosis Date  . Obesity     PAST SURGICAL HISTORY: History reviewed. No pertinent surgical history.  SOCIAL HISTORY: Social History   Tobacco Use  . Smoking status: Former Smoker    Packs/day: 0.20    Years: 5.00    Pack years: 1.00    Types: Cigarettes    Quit date: 09/09/1986    Years since quitting: 32.2  .  Smokeless tobacco: Never Used  Substance Use Topics  . Alcohol use: Yes    Alcohol/week: 14.0 standard drinks    Types: 14 Glasses of wine per week  . Drug use: No    FAMILY HISTORY: Family History  Problem Relation Age of Onset  . Cancer Mother 40       lymphoma  . Cancer Father        renal 1969, 1999  . Arthritis Father     ROS: Review of Systems  Constitutional: Positive for weight loss.  Gastrointestinal: Negative for nausea and vomiting.  Musculoskeletal:       Negative muscle weakness    PHYSICAL EXAM: Blood pressure 117/74, pulse (!) 58, temperature 98.4 F (36.9 C), temperature source Oral, height 5'  4" (1.626 m), weight 178 lb (80.7 kg), SpO2 100 %. Body mass index is 30.55 kg/m. Physical Exam Vitals signs reviewed.  Constitutional:      Appearance: Normal appearance. She is obese.  Cardiovascular:     Rate and Rhythm: Normal rate.     Pulses: Normal pulses.  Pulmonary:     Effort: Pulmonary effort is normal.     Breath sounds: Normal breath sounds.  Musculoskeletal: Normal range of motion.  Skin:    General: Skin is warm and dry.  Neurological:     Mental Status: She is alert and oriented to person, place, and time.  Psychiatric:        Mood and Affect: Mood normal.        Behavior: Behavior normal.     RECENT LABS AND TESTS: BMET    Component Value Date/Time   NA 142 11/01/2018 0809   K 4.6 11/01/2018 0809   CL 105 11/01/2018 0809   CO2 23 11/01/2018 0809   GLUCOSE 81 11/01/2018 0809   GLUCOSE 93 10/12/2017 0946   BUN 14 11/01/2018 0809   CREATININE 0.66 11/01/2018 0809   CALCIUM 9.4 11/01/2018 0809   GFRNONAA 96 11/01/2018 0809   GFRAA 111 11/01/2018 0809   Lab Results  Component Value Date   HGBA1C 5.1 11/01/2018   HGBA1C 5.2 05/06/2018   HGBA1C 5.2 10/12/2017   HGBA1C 5.2 01/18/2017   Lab Results  Component Value Date   INSULIN 3.4 11/01/2018   INSULIN 6.4 05/06/2018   INSULIN 10.0 01/09/2018   CBC    Component Value Date/Time   WBC 6.7 10/12/2017 0946   RBC 4.94 10/12/2017 0946   HGB 15.3 (H) 10/12/2017 0946   HCT 45.7 10/12/2017 0946   PLT 363.0 10/12/2017 0946   MCV 92.6 10/12/2017 0946   MCHC 33.5 10/12/2017 0946   RDW 13.6 10/12/2017 0946   LYMPHSABS 2.2 10/12/2017 0946   MONOABS 0.4 10/12/2017 0946   EOSABS 0.2 10/12/2017 0946   BASOSABS 0.0 10/12/2017 0946   Iron/TIBC/Ferritin/ %Sat No results found for: IRON, TIBC, FERRITIN, IRONPCTSAT Lipid Panel     Component Value Date/Time   CHOL 150 11/01/2018 0809   TRIG 94 11/01/2018 0809   HDL 42 11/01/2018 0809   CHOLHDL 4 10/12/2017 0946   VLDL 23.6 10/12/2017 0946   LDLCALC 89  11/01/2018 0809   Hepatic Function Panel     Component Value Date/Time   PROT 6.4 11/01/2018 0809   ALBUMIN 4.2 11/01/2018 0809   AST 14 11/01/2018 0809   ALT 15 11/01/2018 0809   ALKPHOS 103 11/01/2018 0809   BILITOT 0.3 11/01/2018 0809   BILIDIR 0.0 03/03/2013 1033      Component Value Date/Time  TSH 1.800 01/09/2018 1157   TSH 2.09 10/10/2016 0919   TSH 1.88 10/08/2015 1100      OBESITY BEHAVIORAL INTERVENTION VISIT  Today's visit was # 13   Starting weight: 253 lbs Starting date: 01/09/18 Today's weight : 178 lbs Today's date: 11/28/2018 Total lbs lost to date: 70    ASK: We discussed the diagnosis of obesity with Caren Griffins [name] today and Maleia agreed to give [place] permission to discuss obesity behavioral modification therapy today.  ASSESS: Jaime has the diagnosis of obesity and her BMI today is 30.54 Elmer is in the action stage of change   ADVISE: Jaimey was educated on the multiple health risks of obesity as well as the benefit of weight loss to improve her health. She was advised of the need for long term treatment and the importance of lifestyle modifications to improve her current health and to decrease her risk of future health problems.  AGREE: Multiple dietary modification options and treatment options were discussed and  Satina agreed to follow the recommendations documented in the above note.  ARRANGE: Ernesha was educated on the importance of frequent visits to treat obesity as outlined per CMS and USPSTF guidelines and agreed to schedule her next follow up appointment today.  Wilhemena Durie, am acting as transcriptionist for Abby Potash, PA-C I, Abby Potash, PA-C have reviewed above note and agree with its content

## 2018-12-19 ENCOUNTER — Other Ambulatory Visit: Payer: Self-pay

## 2018-12-19 ENCOUNTER — Ambulatory Visit (INDEPENDENT_AMBULATORY_CARE_PROVIDER_SITE_OTHER): Payer: BC Managed Care – PPO | Admitting: Physician Assistant

## 2018-12-19 ENCOUNTER — Encounter (INDEPENDENT_AMBULATORY_CARE_PROVIDER_SITE_OTHER): Payer: Self-pay | Admitting: Physician Assistant

## 2018-12-19 VITALS — BP 107/71 | HR 54 | Temp 98.2°F | Ht 64.0 in | Wt 176.0 lb

## 2018-12-19 DIAGNOSIS — Z6830 Body mass index (BMI) 30.0-30.9, adult: Secondary | ICD-10-CM

## 2018-12-19 DIAGNOSIS — E669 Obesity, unspecified: Secondary | ICD-10-CM | POA: Diagnosis not present

## 2018-12-19 DIAGNOSIS — E559 Vitamin D deficiency, unspecified: Secondary | ICD-10-CM | POA: Diagnosis not present

## 2018-12-23 NOTE — Progress Notes (Signed)
Office: [phone]  /  Fax: [phone]   HPI:   Chief Complaint: OBESITY Judith [name] is here to discuss her progress with her obesity treatment plan. She is on the Category 2 plan and is following her eating plan approximately 87% of the time. She states she is walking 40 minutes 1 time per week. [name] reports that she has not had any issues with the plan. As she approaches her maintenance weight, her weight loss has slowed as expected but she is still slightly disappointed that it wasn't more.  Her weight is 176 lb (79.8 kg) today and has had a weight loss of 2 pounds over a period of 3 weeks since her last visit. She has lost 77 lbs since starting treatment with [place].  Vitamin D deficiency Judith [name] has a diagnosis of Vitamin D deficiency. She is currently taking OTC Vit D and denies nausea, vomiting or muscle weakness.  ASSESSMENT AND PLAN:  Vitamin D deficiency  Class 1 obesity with serious comorbidity and body mass index (BMI) of 30.0 to 30.9 in adult, unspecified obesity type  PLAN:  Vitamin D Deficiency Judith [name] was informed that low Vitamin D levels contributes to fatigue and are associated with obesity, breast, and colon cancer. She agrees to continue taking OTC Vit D and will follow-up for routine testing of Vitamin D, at least 2-3 times per year. She was informed of the risk of over-replacement of Vitamin D and agrees to not increase her dose unless she discusses this with [place] first. Judith [name] agrees to follow-up with our clinic in 4 weeks.  I spent > than 50% of the 15 minute visit on counseling as documented in the note.  Obesity Judith [name] is currently in the action stage of change. As such, her goal is to continue with weight loss efforts. She has agreed to follow the Category 2 plan. Judith [name] has been instructed to work up to a goal of 150 minutes of combined cardio and strengthening exercise per week for weight loss and overall health benefits. We discussed the following  Behavioral Modification Strategies today: work on meal planning and easy cooking plans, and keeping healthy foods in the home.  Judith [name] has agreed to follow-up with our clinic in 4 weeks. She was informed of the importance of frequent follow-up visits to maximize her success with intensive lifestyle modifications for her multiple health conditions.  ALLERGIES: No Known Allergies  MEDICATIONS: Current Outpatient Medications on File Prior to Visit  Medication Sig Dispense Refill   Calcium-Magnesium-Vitamin D (CALCIUM 1200+D3 PO) Take 1 tablet by mouth daily. 1000U vit d     Omega-3 Fatty Acids (FISH OIL) 1200 MG CAPS Take 1 capsule by mouth daily.     vitamin C (ASCORBIC ACID) 500 MG tablet Take 500 mg by mouth daily.     Vitamin D, Ergocalciferol, (DRISDOL) 1.25 MG (50000 UT) CAPS capsule Take 1 capsule (50,000 Units total) by mouth every 7 (seven) days. 12 capsule 0   No current facility-administered medications on file prior to visit.     PAST MEDICAL HISTORY: Past Medical History:  Diagnosis Date   Obesity     PAST SURGICAL HISTORY: History reviewed. No pertinent surgical history.  SOCIAL HISTORY: Social History   Tobacco Use   Smoking status: Former Smoker    Packs/day: 0.20    Years: 5.00    Pack years: 1.00    Types: Cigarettes    Quit date: 09/09/1986    Years since quitting: 32.3   Smokeless tobacco: Never Used  Substance Use Topics   Alcohol use: Yes    Alcohol/week: 14.0 standard drinks    Types: 14 Glasses of wine per week   Drug use: No    FAMILY HISTORY: Family History  Problem Relation Age of Onset   Cancer Mother 24       lymphoma   Cancer Father        renal 1969, 1999   Arthritis Father    ROS: Review of Systems  Gastrointestinal: Negative for nausea and vomiting.  Musculoskeletal:       Negative for muscle weakness.   PHYSICAL EXAM: Blood pressure 107/71, pulse (!) 54, temperature 98.2 F (36.8 C), temperature source Oral,  height 5\' 4"  (1.626 m), weight 176 lb (79.8 kg), SpO2 93 %. Body mass index is 30.21 kg/m. Physical Exam Vitals signs reviewed.  Constitutional:      Appearance: Normal appearance. She is obese.  Cardiovascular:     Rate and Rhythm: Normal rate.     Pulses: Normal pulses.  Pulmonary:     Effort: Pulmonary effort is normal.     Breath sounds: Normal breath sounds.  Musculoskeletal: Normal range of motion.  Skin:    General: Skin is warm and dry.  Neurological:     Mental Status: She is alert and oriented to person, place, and time.  Psychiatric:        Behavior: Behavior normal.   RECENT LABS AND TESTS: BMET    Component Value Date/Time   NA 142 11/01/2018 0809   K 4.6 11/01/2018 0809   CL 105 11/01/2018 0809   CO2 23 11/01/2018 0809   GLUCOSE 81 11/01/2018 0809   GLUCOSE 93 10/12/2017 0946   BUN 14 11/01/2018 0809   CREATININE 0.66 11/01/2018 0809   CALCIUM 9.4 11/01/2018 0809   GFRNONAA 96 11/01/2018 0809   GFRAA 111 11/01/2018 0809   Lab Results  Component Value Date   HGBA1C 5.1 11/01/2018   HGBA1C 5.2 05/06/2018   HGBA1C 5.2 10/12/2017   HGBA1C 5.2 01/18/2017   Lab Results  Component Value Date   INSULIN 3.4 11/01/2018   INSULIN 6.4 05/06/2018   INSULIN 10.0 01/09/2018   CBC    Component Value Date/Time   WBC 6.7 10/12/2017 0946   RBC 4.94 10/12/2017 0946   HGB 15.3 (H) 10/12/2017 0946   HCT 45.7 10/12/2017 0946   PLT 363.0 10/12/2017 0946   MCV 92.6 10/12/2017 0946   MCHC 33.5 10/12/2017 0946   RDW 13.6 10/12/2017 0946   LYMPHSABS 2.2 10/12/2017 0946   MONOABS 0.4 10/12/2017 0946   EOSABS 0.2 10/12/2017 0946   BASOSABS 0.0 10/12/2017 0946   Iron/TIBC/Ferritin/ %Sat No results found for: IRON, TIBC, FERRITIN, IRONPCTSAT Lipid Panel     Component Value Date/Time   CHOL 150 11/01/2018 0809   TRIG 94 11/01/2018 0809   HDL 42 11/01/2018 0809   CHOLHDL 4 10/12/2017 0946   VLDL 23.6 10/12/2017 0946   LDLCALC 89 11/01/2018 0809   Hepatic  Function Panel     Component Value Date/Time   PROT 6.4 11/01/2018 0809   ALBUMIN 4.2 11/01/2018 0809   AST 14 11/01/2018 0809   ALT 15 11/01/2018 0809   ALKPHOS 103 11/01/2018 0809   BILITOT 0.3 11/01/2018 0809   BILIDIR 0.0 03/03/2013 1033      Component Value Date/Time   TSH 1.800 01/09/2018 1157   TSH 2.09 10/10/2016 0919   TSH 1.88 10/08/2015 1100   Results for [name], Lakeyn "[name]" (MRN [id number]) as  of 12/23/2018 12:04  Ref. Range 11/01/2018 08:09  Vitamin D, 25-Hydroxy Latest Ref Range: 30.0 - 100.0 ng/mL 35.8   OBESITY BEHAVIORAL INTERVENTION VISIT  Today's visit was #14   Starting weight: 253 lbs Starting date: 01/09/2018 Today's weight: 176 lbs Today's date: 12/19/2018 Total lbs lost to date: 77    12/19/2018  Height 5\' 4"  (1.626 m)  Weight 176 lb (79.8 kg)  BMI (Calculated) 30.2  BLOOD PRESSURE - SYSTOLIC 696  BLOOD PRESSURE - DIASTOLIC 71   Body Fat % 29.5 %  Total Body Water (lbs) 77.2 lbs   ASK: We discussed the diagnosis of obesity with Judith [name] today and Judith [name] agreed to give [place] permission to discuss obesity behavioral modification therapy today.  ASSESS: Judith [name] has the diagnosis of obesity and her BMI today is 30.2. Judith [name] is in the action stage of change.   ADVISE: Judith [name] was educated on the multiple health risks of obesity as well as the benefit of weight loss to improve her health. She was advised of the need for long term treatment and the importance of lifestyle modifications to improve her current health and to decrease her risk of future health problems.  AGREE: Multiple dietary modification options and treatment options were discussed and  Judith [name] agreed to follow the recommendations documented in the above note.  ARRANGE: Judith [name] was educated on the importance of frequent visits to treat obesity as outlined per CMS and USPSTF guidelines and agreed to schedule her next follow up appointment today.  Migdalia Dk, am acting as transcriptionist for Judith Potash, PA-C I, Judith Potash, PA-C have reviewed above note and agree with its content

## 2018-12-31 ENCOUNTER — Other Ambulatory Visit: Payer: Self-pay | Admitting: Family Medicine

## 2018-12-31 DIAGNOSIS — Z1231 Encounter for screening mammogram for malignant neoplasm of breast: Secondary | ICD-10-CM

## 2019-01-21 ENCOUNTER — Ambulatory Visit (INDEPENDENT_AMBULATORY_CARE_PROVIDER_SITE_OTHER): Payer: BC Managed Care – PPO | Admitting: Physician Assistant

## 2019-01-21 ENCOUNTER — Encounter (INDEPENDENT_AMBULATORY_CARE_PROVIDER_SITE_OTHER): Payer: Self-pay | Admitting: Physician Assistant

## 2019-01-21 ENCOUNTER — Other Ambulatory Visit: Payer: Self-pay

## 2019-01-21 VITALS — BP 106/73 | HR 70 | Temp 98.6°F | Ht 64.0 in | Wt 171.0 lb

## 2019-01-21 DIAGNOSIS — E669 Obesity, unspecified: Secondary | ICD-10-CM

## 2019-01-21 DIAGNOSIS — Z6830 Body mass index (BMI) 30.0-30.9, adult: Secondary | ICD-10-CM

## 2019-01-21 DIAGNOSIS — E559 Vitamin D deficiency, unspecified: Secondary | ICD-10-CM | POA: Diagnosis not present

## 2019-01-22 NOTE — Progress Notes (Signed)
Office: [phone]  /  Fax: [phone]   HPI:   Chief Complaint: OBESITY Khadijha is here to discuss her progress with her obesity treatment plan. She is on the  follow the Category 2 plan and is following her eating plan approximately 80 % of the time. She states she is exercising 0 minutes 0 times per week. Paxten did very well with weight loss. She is following the plan closely and she is feeling good. Her weight is 171 lb (77.6 kg) today and has had a weight loss of 5 pounds over a period of 4 to 5 weeks since her last visit. She has lost 82 lbs since starting treatment with [place].  Vitamin D deficiency Ziyonna has a diagnosis of vitamin D deficiency. She is currently taking vit D and denies nausea, vomiting or muscle weakness.  ASSESSMENT AND PLAN:  Vitamin D deficiency  Class 1 obesity with serious comorbidity and body mass index (BMI) of 30.0 to 30.9 in adult, unspecified obesity type - BMI greater than 30 at start of program   PLAN:  Vitamin D Deficiency Taleeah was informed that low vitamin D levels contributes to fatigue and are associated with obesity, breast, and colon cancer. Deja will continue to take prescription Vit D @50 ,000 IU every week and will follow up for routine testing of vitamin D, at least 2-3 times per year. She was informed of the risk of over-replacement of vitamin D and agrees to not increase her dose unless she discusses this with [place] first.  I spent > than 50% of the 25 minute visit on counseling as documented in the note.  Obesity Taisley is currently in the action stage of change. As such, her goal is to continue with weight loss efforts She has agreed to follow the Category 2 plan Ileta has been instructed to work up to a goal of 150 minutes of combined cardio and strengthening exercise per week for weight loss and overall health benefits. We discussed the following Behavioral Modification Strategies today: keeping healthy foods in the home  and work on meal planning and easy cooking plans  Williemae has agreed to follow up with our clinic in 5 weeks. She was informed of the importance of frequent follow up visits to maximize her success with intensive lifestyle modifications for her multiple health conditions.  I spent > than 50% of the 25 minute visit on counseling as documented in the note.    ALLERGIES: No Known Allergies  MEDICATIONS: Current Outpatient Medications on File Prior to Visit  Medication Sig Dispense Refill   Calcium-Magnesium-Vitamin D (CALCIUM 1200+D3 PO) Take 1 tablet by mouth daily. 1000U vit d     Omega-3 Fatty Acids (FISH OIL) 1200 MG CAPS Take 1 capsule by mouth daily.     vitamin C (ASCORBIC ACID) 500 MG tablet Take 500 mg by mouth daily.     Vitamin D, Ergocalciferol, (DRISDOL) 1.25 MG (50000 UT) CAPS capsule Take 1 capsule (50,000 Units total) by mouth every 7 (seven) days. 12 capsule 0   No current facility-administered medications on file prior to visit.     PAST MEDICAL HISTORY: Past Medical History:  Diagnosis Date   Obesity     PAST SURGICAL HISTORY: History reviewed. No pertinent surgical history.  SOCIAL HISTORY: Social History   Tobacco Use   Smoking status: Former Smoker    Packs/day: 0.20    Years: 5.00    Pack years: 1.00    Types: Cigarettes    Quit date:  09/09/1986    Years since quitting: 32.3   Smokeless tobacco: Never Used  Substance Use Topics   Alcohol use: Yes    Alcohol/week: 14.0 standard drinks    Types: 14 Glasses of wine per week   Drug use: No    FAMILY HISTORY: Family History  Problem Relation Age of Onset   Cancer Mother 71       lymphoma   Cancer Father        renal 1969, 1999   Arthritis Father     ROS: Review of Systems  Constitutional: Positive for weight loss.  Gastrointestinal: Negative for nausea and vomiting.  Musculoskeletal:       Negative for muscle weakness    PHYSICAL EXAM: Blood pressure 106/73, pulse 70,  temperature 98.6 F (37 C), temperature source Oral, height 5\' 4"  (1.626 m), weight 171 lb (77.6 kg), SpO2 98 %. Body mass index is 29.35 kg/m. Physical Exam Vitals signs reviewed.  Constitutional:      Appearance: Normal appearance. She is well-developed. She is obese.  Cardiovascular:     Rate and Rhythm: Normal rate.  Pulmonary:     Effort: Pulmonary effort is normal.  Musculoskeletal: Normal range of motion.  Skin:    General: Skin is warm and dry.  Neurological:     Mental Status: She is alert and oriented to person, place, and time.  Psychiatric:        Mood and Affect: Mood normal.        Behavior: Behavior normal.     RECENT LABS AND TESTS: BMET    Component Value Date/Time   NA 142 11/01/2018 0809   K 4.6 11/01/2018 0809   CL 105 11/01/2018 0809   CO2 23 11/01/2018 0809   GLUCOSE 81 11/01/2018 0809   GLUCOSE 93 10/12/2017 0946   BUN 14 11/01/2018 0809   CREATININE 0.66 11/01/2018 0809   CALCIUM 9.4 11/01/2018 0809   GFRNONAA 96 11/01/2018 0809   GFRAA 111 11/01/2018 0809   Lab Results  Component Value Date   HGBA1C 5.1 11/01/2018   HGBA1C 5.2 05/06/2018   HGBA1C 5.2 10/12/2017   HGBA1C 5.2 01/18/2017   Lab Results  Component Value Date   INSULIN 3.4 11/01/2018   INSULIN 6.4 05/06/2018   INSULIN 10.0 01/09/2018   CBC    Component Value Date/Time   WBC 6.7 10/12/2017 0946   RBC 4.94 10/12/2017 0946   HGB 15.3 (H) 10/12/2017 0946   HCT 45.7 10/12/2017 0946   PLT 363.0 10/12/2017 0946   MCV 92.6 10/12/2017 0946   MCHC 33.5 10/12/2017 0946   RDW 13.6 10/12/2017 0946   LYMPHSABS 2.2 10/12/2017 0946   MONOABS 0.4 10/12/2017 0946   EOSABS 0.2 10/12/2017 0946   BASOSABS 0.0 10/12/2017 0946   Iron/TIBC/Ferritin/ %Sat No results found for: IRON, TIBC, FERRITIN, IRONPCTSAT Lipid Panel     Component Value Date/Time   CHOL 150 11/01/2018 0809   TRIG 94 11/01/2018 0809   HDL 42 11/01/2018 0809   CHOLHDL 4 10/12/2017 0946   VLDL 23.6 10/12/2017  0946   LDLCALC 89 11/01/2018 0809   Hepatic Function Panel     Component Value Date/Time   PROT 6.4 11/01/2018 0809   ALBUMIN 4.2 11/01/2018 0809   AST 14 11/01/2018 0809   ALT 15 11/01/2018 0809   ALKPHOS 103 11/01/2018 0809   BILITOT 0.3 11/01/2018 0809   BILIDIR 0.0 03/03/2013 1033      Component Value Date/Time   TSH 1.800 01/09/2018  1157   TSH 2.09 10/10/2016 0919   TSH 1.88 10/08/2015 1100     Ref. Range 11/01/2018 08:09  Vitamin D, 25-Hydroxy Latest Ref Range: 30.0 - 100.0 ng/mL 35.8    OBESITY BEHAVIORAL INTERVENTION VISIT  Today's visit was # 15   Starting weight: 253 lbs Starting date: 01/09/2018 Today's weight : 171 lbs Today's date: 01/21/2019 Total lbs lost to date: 82    01/21/2019  Height 5\' 4"  (1.626 m)  Weight 171 lb (77.6 kg)  BMI (Calculated) 29.34  BLOOD PRESSURE - SYSTOLIC [id number]  BLOOD PRESSURE - DIASTOLIC 73   Body Fat % 47 %    ASK: We discussed the diagnosis of obesity with Caren Griffins [name] today and Quina agreed to give [place] permission to discuss obesity behavioral modification therapy today.  ASSESS: Ashmita has the diagnosis of obesity and her BMI today is 29.34 Shelsey is in the action stage of change   ADVISE: Daven was educated on the multiple health risks of obesity as well as the benefit of weight loss to improve her health. She was advised of the need for long term treatment and the importance of lifestyle modifications to improve her current health and to decrease her risk of future health problems.  AGREE: Multiple dietary modification options and treatment options were discussed and  Chinyere agreed to follow the recommendations documented in the above note.  ARRANGE: Hazell was educated on the importance of frequent visits to treat obesity as outlined per CMS and USPSTF guidelines and agreed to schedule her next follow up appointment today.  Corey Skains, am acting as transcriptionist for Abby Potash, PA-C I,  Abby Potash, PA-C have reviewed above note and agree with its content

## 2019-02-10 ENCOUNTER — Ambulatory Visit
Admission: RE | Admit: 2019-02-10 | Discharge: 2019-02-10 | Disposition: A | Discharge status: Home / Self Care | Payer: BC Managed Care – PPO | Source: Ambulatory Visit | Attending: Family Medicine | Admitting: Family Medicine

## 2019-02-10 ENCOUNTER — Other Ambulatory Visit: Payer: Self-pay

## 2019-02-10 DIAGNOSIS — Z1231 Encounter for screening mammogram for malignant neoplasm of breast: Secondary | ICD-10-CM

## 2019-02-14 ENCOUNTER — Other Ambulatory Visit: Payer: Self-pay

## 2019-02-17 ENCOUNTER — Encounter: Payer: Self-pay | Admitting: Family Medicine

## 2019-02-17 ENCOUNTER — Ambulatory Visit (INDEPENDENT_AMBULATORY_CARE_PROVIDER_SITE_OTHER): Payer: BC Managed Care – PPO | Admitting: Family Medicine

## 2019-02-17 ENCOUNTER — Other Ambulatory Visit (HOSPITAL_COMMUNITY)
Admission: RE | Admit: 2019-02-17 | Discharge: 2019-02-17 | Disposition: A | Discharge status: Home / Self Care | Payer: BC Managed Care – PPO | Source: Ambulatory Visit | Attending: Family Medicine | Admitting: Family Medicine

## 2019-02-17 ENCOUNTER — Other Ambulatory Visit: Payer: Self-pay

## 2019-02-17 VITALS — BP 103/65 | HR 64 | Temp 96.6°F | Resp 12 | Ht 64.6 in | Wt 171.0 lb

## 2019-02-17 DIAGNOSIS — E559 Vitamin D deficiency, unspecified: Secondary | ICD-10-CM

## 2019-02-17 DIAGNOSIS — Z1211 Encounter for screening for malignant neoplasm of colon: Secondary | ICD-10-CM

## 2019-02-17 DIAGNOSIS — Z23 Encounter for immunization: Secondary | ICD-10-CM

## 2019-02-17 DIAGNOSIS — Z124 Encounter for screening for malignant neoplasm of cervix: Secondary | ICD-10-CM

## 2019-02-17 DIAGNOSIS — H9313 Tinnitus, bilateral: Secondary | ICD-10-CM | POA: Diagnosis not present

## 2019-02-17 DIAGNOSIS — Z0000 Encounter for general adult medical examination without abnormal findings: Secondary | ICD-10-CM | POA: Diagnosis not present

## 2019-02-17 LAB — COMPREHENSIVE METABOLIC PANEL
ALT: 12 U/L (ref 0–35)
AST: 12 U/L (ref 0–37)
Albumin: 4 g/dL (ref 3.5–5.2)
Alkaline Phosphatase: 93 U/L (ref 39–117)
BUN: 13 mg/dL (ref 6–23)
CO2: 29 mEq/L (ref 19–32)
Calcium: 9.6 mg/dL (ref 8.4–10.5)
Chloride: 106 mEq/L (ref 96–112)
Creatinine, Ser: 0.69 mg/dL (ref 0.40–1.20)
GFR: 86.49 mL/min (ref >60.00)
Glucose, Bld: 86 mg/dL (ref 70–99)
Potassium: 4.4 mEq/L (ref 3.5–5.1)
Sodium: 141 mEq/L (ref 135–145)
Total Bilirubin: 0.5 mg/dL (ref 0.2–1.2)
Total Protein: 6.1 g/dL (ref 6.0–8.3)

## 2019-02-17 LAB — LIPID PANEL
Cholesterol: 148 mg/dL (ref 0–200)
HDL: 45.1 mg/dL (ref >39.00)
LDL Cholesterol: 93 mg/dL (ref 0–99)
NonHDL: 102.52
Total CHOL/HDL Ratio: 3
Triglycerides: 47 mg/dL (ref 0.0–149.0)
VLDL: 9.4 mg/dL (ref 0.0–40.0)

## 2019-02-17 LAB — CBC WITH DIFFERENTIAL/PLATELET
Basophils Absolute: 0 10*3/uL (ref 0.0–0.1)
Basophils Relative: 0.5 % (ref 0.0–3.0)
Eosinophils Absolute: 0.2 10*3/uL (ref 0.0–0.7)
Eosinophils Relative: 4.2 % (ref 0.0–5.0)
HCT: 42.9 % (ref 36.0–46.0)
Hemoglobin: 14.5 g/dL (ref 12.0–15.0)
Lymphocytes Relative: 40.4 % (ref 12.0–46.0)
Lymphs Abs: 2.3 10*3/uL (ref 0.7–4.0)
MCHC: 33.7 g/dL (ref 30.0–36.0)
MCV: 91.1 fl (ref 78.0–100.0)
Monocytes Absolute: 0.4 10*3/uL (ref 0.1–1.0)
Monocytes Relative: 6.8 % (ref 3.0–12.0)
Neutro Abs: 2.8 10*3/uL (ref 1.4–7.7)
Neutrophils Relative %: 48.1 % (ref 43.0–77.0)
Platelets: 294 10*3/uL (ref 150.0–400.0)
RBC: 4.71 Mil/uL (ref 3.87–5.11)
RDW: 13.7 % (ref 11.5–15.5)
WBC: 5.8 10*3/uL (ref 4.0–10.5)

## 2019-02-17 LAB — POC HEMOCCULT BLD/STL (OFFICE/1-CARD/DIAGNOSTIC)
Card #1 Date: 10122020
Fecal Occult Blood, POC: NEGATIVE

## 2019-02-17 LAB — VITAMIN D 25 HYDROXY (VIT D DEFICIENCY, FRACTURES): VITD: 49.04 ng/mL (ref 30.00–100.00)

## 2019-02-17 LAB — TSH: TSH: 2.91 u[IU]/mL (ref 0.35–4.50)

## 2019-02-17 NOTE — Progress Notes (Signed)
Subjective:     Judith [name] is a 63 y.o. female and is here for a comprehensive physical exam. The patient reports no problems.  Social History   Socioeconomic History  . Marital status: Married    Spouse name: Juanda Crumble  . Number of children: 0  . Years of education: Not on file  . Highest education level: Not on file  Occupational History  . Occupation: client services associates  Social Needs  . Financial resource strain: Not on file  . Food insecurity    Worry: Not on file    Inability: Not on file  . Transportation needs    Medical: Not on file    Non-medical: Not on file  Tobacco Use  . Smoking status: Former Smoker    Packs/day: 0.20    Years: 5.00    Pack years: 1.00    Types: Cigarettes    Quit date: 09/09/1986    Years since quitting: 32.4  . Smokeless tobacco: Never Used  Substance and Sexual Activity  . Alcohol use: Yes    Alcohol/week: 14.0 standard drinks    Types: 14 Glasses of wine per week  . Drug use: No  . Sexual activity: Yes    Partners: Male  Lifestyle  . Physical activity    Days per week: Not on file    Minutes per session: Not on file  . Stress: Not on file  Relationships  . Social Herbalist on phone: Not on file    Gets together: Not on file    Attends religious service: Not on file    Active member of club or organization: Not on file    Attends meetings of clubs or organizations: Not on file    Relationship status: Not on file  . Intimate partner violence    Fear of current or ex partner: Not on file    Emotionally abused: Not on file    Physically abused: Not on file    Forced sexual activity: Not on file  Other Topics Concern  . Not on file  Social History Narrative   Exercise--walk with dog   Health Maintenance  Topic Date Due  . PAP SMEAR-Modifier  10/08/2018  . INFLUENZA VACCINE  12/07/2018  . HIV Screening  10/11/2027 (Originally 02/12/1971)  . MAMMOGRAM  02/10/2020  . COLONOSCOPY  02/17/2020  .  TETANUS/TDAP  09/08/2021  . Hepatitis C Screening  Completed    The following portions of the patient's history were reviewed and updated as appropriate: She  has a past medical history of Obesity. She does not have any pertinent problems on file. She  has no past surgical history on file. Her family history includes Arthritis in her father; Cancer in her father; Cancer (age of onset: 52) in her mother. She  reports that she quit smoking about 32 years ago. Her smoking use included cigarettes. She has a 1.00 pack-year smoking history. She has never used smokeless tobacco. She reports current alcohol use of about 14.0 standard drinks of alcohol per week. She reports that she does not use drugs. She has a current medication list which includes the following prescription(s): calcium-magnesium-vitamin d, fish oil, vitamin c, and vitamin d (ergocalciferol). Current Outpatient Medications on File Prior to Visit  Medication Sig Dispense Refill  . Calcium-Magnesium-Vitamin D (CALCIUM 1200+D3 PO) Take 1 tablet by mouth daily. 1000U vit d    . Omega-3 Fatty Acids (FISH OIL) 1200 MG CAPS Take 1 capsule by mouth daily.    Marland Kitchen  vitamin C (ASCORBIC ACID) 500 MG tablet Take 500 mg by mouth daily.    . Vitamin D, Ergocalciferol, (DRISDOL) 1.25 MG (50000 UT) CAPS capsule Take 1 capsule (50,000 Units total) by mouth every 7 (seven) days. 12 capsule 0   No current facility-administered medications on file prior to visit.    She has No Known Allergies..  Review of Systems Review of Systems  Constitutional: Negative for activity change, appetite change and fatigue.  HENT: Negative for hearing loss, congestion, and ear discharge.  dentist q67m  +tinnitis b/l  Eyes: Negative for visual disturbance (see optho q1y -- vision corrected to 20/20 with glasses).  Respiratory: Negative for cough, chest tightness and shortness of breath.   Cardiovascular: Negative for chest pain, palpitations and leg swelling.   Gastrointestinal: Negative for abdominal pain, diarrhea, constipation and abdominal distention.  Genitourinary: Negative for urgency, frequency, decreased urine volume and difficulty urinating.  Musculoskeletal: Negative for back pain, arthralgias and gait problem.  Skin: Negative for color change, pallor and rash.  Neurological: Negative for dizziness, light-headedness, numbness and headaches.  Hematological: Negative for adenopathy. Does not bruise/bleed easily.  Psychiatric/Behavioral: Negative for suicidal ideas, confusion, sleep disturbance, self-injury, dysphoric mood, decreased concentration and agitation.       Objective:    BP 103/65 (BP Location: Left Arm, Cuff Size: Normal)   Pulse 64   Temp (!) 96.6 F (35.9 C) (Temporal)   Resp 12   Ht 5' 4.6" (1.641 m)   Wt 171 lb (77.6 kg)   SpO2 100%   BMI 28.81 kg/m  General appearance: alert, cooperative, appears stated age and no distress Head: Normocephalic, without obvious abnormality, atraumatic Eyes: negative findings: lids and lashes normal, conjunctivae and sclerae normal and pupils equal, round, reactive to light and accomodation Ears: normal TM's and external ear canals both ears Nose: Nares normal. Septum midline. Mucosa normal. No drainage or sinus tenderness. Throat: lips, mucosa, and tongue normal; teeth and gums normal Neck: no adenopathy, no carotid bruit, no JVD, supple, symmetrical, trachea midline and thyroid not enlarged, symmetric, no tenderness/mass/nodules Back: symmetric, no curvature. ROM normal. No CVA tenderness. Lungs: clear to auscultation bilaterally Breasts: normal appearance, no masses or tenderness Heart: regular rate and rhythm, S1, S2 normal, no murmur, click, rub or gallop Abdomen: soft, non-tender; bowel sounds normal; no masses,  no organomegaly Pelvic: cervix normal in appearance, external genitalia normal, no adnexal masses or tenderness, no cervical motion tenderness, rectovaginal septum  normal, uterus normal size, shape, and consistency, vagina normal without discharge and pap done Extremities: extremities normal, atraumatic, no cyanosis or edema Pulses: 2+ and symmetric Skin: Skin color, texture, turgor normal. No rashes or lesions Lymph nodes: Cervical, supraclavicular, and axillary nodes normal. Neurologic: Alert and oriented X 3, normal strength and tone. Normal symmetric reflexes. Normal coordination and gait    Assessment:    Healthy female exam.      Plan:    ghm utd Check labs  See After Visit Summary for Counseling Recommendations    1. Cervical cancer screening  - Cytology - PAP( [facility])  2. Vitamin D deficiency  - Vitamin D (25 hydroxy)  3. Preventative health care See above  - Lipid panel - CBC with Differential/Platelet - TSH - Comprehensive metabolic panel - Vitamin D (25 hydroxy)  4. Tinnitus of both ears Pt feels like she has fluid in her ears= On exam they were normal She can try antihistamine ie claritin , zyrtec and flonase or nasacort  Refer to ent  for hearing check and tinnitus  - Ambulatory referral to ENT  5. Need for influenza vaccination   - Flu Vaccine QUAD 36+ mos IM

## 2019-02-17 NOTE — Patient Instructions (Signed)

## 2019-02-25 ENCOUNTER — Encounter (INDEPENDENT_AMBULATORY_CARE_PROVIDER_SITE_OTHER): Payer: Self-pay | Admitting: Physician Assistant

## 2019-02-25 ENCOUNTER — Other Ambulatory Visit: Payer: Self-pay

## 2019-02-25 ENCOUNTER — Ambulatory Visit (INDEPENDENT_AMBULATORY_CARE_PROVIDER_SITE_OTHER): Payer: BC Managed Care – PPO | Admitting: Physician Assistant

## 2019-02-25 VITALS — BP 113/75 | HR 67 | Temp 98.4°F | Ht 64.0 in | Wt 166.0 lb

## 2019-02-25 DIAGNOSIS — Z6830 Body mass index (BMI) 30.0-30.9, adult: Secondary | ICD-10-CM

## 2019-02-25 DIAGNOSIS — E559 Vitamin D deficiency, unspecified: Secondary | ICD-10-CM

## 2019-02-25 DIAGNOSIS — E669 Obesity, unspecified: Secondary | ICD-10-CM

## 2019-02-25 LAB — CYTOLOGY - PAP
Comment: NEGATIVE
Diagnosis: NEGATIVE
High risk HPV: NEGATIVE

## 2019-02-26 NOTE — Progress Notes (Signed)
Office: [phone]  /  Fax: [phone]   HPI:   Chief Complaint: OBESITY Judith [name] is here to discuss her progress with her obesity treatment plan. She is on the Category 2 plan and is following her eating plan approximately 84% of the time. She states she is exercising 0 minutes 0 times per week. Judith [name] reports that she is doing well on the plan and has not had any struggles. Her weight is 166 lb (75.3 kg) today and has had a weight loss of 5 pounds over a period of 5 weeks since her last visit. She has lost 87 lbs since starting treatment with [place].  Vitamin D deficiency Judith [name] has a diagnosis of Vitamin D deficiency. She is currently taking Vit D and denies nausea, vomiting or muscle weakness.  ASSESSMENT AND PLAN:  Vitamin D deficiency  Class 1 obesity with serious comorbidity and body mass index (BMI) of 30.0 to 30.9 in adult, unspecified obesity type - Starting BMI greater then 30  PLAN:  Vitamin D Deficiency Judith [name] was informed that low Vitamin D levels contributes to fatigue and are associated with obesity, breast, and colon cancer. She agrees to change to OTC Vit D 5,000 units daily (no prescription needed) and will follow-up for routine testing of Vitamin D, at least 2-3 times per year. She was informed of the risk of over-replacement of Vitamin D and agrees to not increase her dose unless she discusses this with [place] first. Judith [name] agrees to follow-up with our clinic in 4-5 weeks.  I spent > than 50% of the 15 minute visit on counseling as documented in the note.  Obesity Judith [name] is currently in the action stage of change. As such, her goal is to continue with weight loss efforts. She has agreed to follow the Category 2 plan. Judith [name] has been instructed to work up to a goal of 150 minutes of combined cardio and strengthening exercise per week for weight loss and overall health benefits. We discussed the following Behavioral Modification Strategies today: work on meal  planning and easy cooking plans, and keeping healthy foods in the home.  Judith [name] has agreed to follow-up with our clinic in 4-5 weeks. She was informed of the importance of frequent follow-up visits to maximize her success with intensive lifestyle modifications for her multiple health conditions.  ALLERGIES: No Known Allergies  MEDICATIONS: Current Outpatient Medications on File Prior to Visit  Medication Sig Dispense Refill   Cholecalciferol (VITAMIN D3) 125 MCG (5000 UT) CAPS Take 5,000 Units by mouth daily.     Calcium-Magnesium-Vitamin D (CALCIUM 1200+D3 PO) Take 1 tablet by mouth daily. 1000U vit d     Omega-3 Fatty Acids (FISH OIL) 1200 MG CAPS Take 1 capsule by mouth daily.     vitamin C (ASCORBIC ACID) 500 MG tablet Take 500 mg by mouth daily.     No current facility-administered medications on file prior to visit.     PAST MEDICAL HISTORY: Past Medical History:  Diagnosis Date   Obesity     PAST SURGICAL HISTORY: History reviewed. No pertinent surgical history.  SOCIAL HISTORY: Social History   Tobacco Use   Smoking status: Former Smoker    Packs/day: 0.20    Years: 5.00    Pack years: 1.00    Types: Cigarettes    Quit date: 09/09/1986    Years since quitting: 32.4   Smokeless tobacco: Former Systems developer    Quit date: 02/16/1985  Substance Use Topics   Alcohol use: Yes  Alcohol/week: 14.0 standard drinks    Types: 14 Glasses of wine per week   Drug use: No    FAMILY HISTORY: Family History  Problem Relation Age of Onset   Cancer Mother 50       lymphoma   Cancer Father        renal 1969, 1999   Arthritis Father    ROS: Review of Systems  Gastrointestinal: Negative for nausea and vomiting.  Musculoskeletal:       Negative for muscle weakness.   PHYSICAL EXAM: Blood pressure 113/75, pulse 67, temperature 98.4 F (36.9 C), temperature source Oral, height 5\' 4"  (1.626 m), weight 166 lb (75.3 kg), SpO2 100 %. Body mass index is 28.49  kg/m. Physical Exam Vitals signs reviewed.  Constitutional:      Appearance: Normal appearance. She is obese.  Cardiovascular:     Rate and Rhythm: Normal rate.     Pulses: Normal pulses.  Pulmonary:     Effort: Pulmonary effort is normal.     Breath sounds: Normal breath sounds.  Musculoskeletal: Normal range of motion.  Skin:    General: Skin is warm and dry.  Neurological:     Mental Status: She is alert and oriented to person, place, and time.  Psychiatric:        Behavior: Behavior normal.   RECENT LABS AND TESTS: BMET    Component Value Date/Time   NA 141 02/17/2019 0905   NA 142 11/01/2018 0809   K 4.4 02/17/2019 0905   CL 106 02/17/2019 0905   CO2 29 02/17/2019 0905   GLUCOSE 86 02/17/2019 0905   BUN 13 02/17/2019 0905   BUN 14 11/01/2018 0809   CREATININE 0.69 02/17/2019 0905   CALCIUM 9.6 02/17/2019 0905   GFRNONAA 96 11/01/2018 0809   GFRAA 111 11/01/2018 0809   Lab Results  Component Value Date   HGBA1C 5.1 11/01/2018   HGBA1C 5.2 05/06/2018   HGBA1C 5.2 10/12/2017   HGBA1C 5.2 01/18/2017   Lab Results  Component Value Date   INSULIN 3.4 11/01/2018   INSULIN 6.4 05/06/2018   INSULIN 10.0 01/09/2018   CBC    Component Value Date/Time   WBC 5.8 02/17/2019 0905   RBC 4.71 02/17/2019 0905   HGB 14.5 02/17/2019 0905   HCT 42.9 02/17/2019 0905   PLT 294.0 02/17/2019 0905   MCV 91.1 02/17/2019 0905   MCHC 33.7 02/17/2019 0905   RDW 13.7 02/17/2019 0905   LYMPHSABS 2.3 02/17/2019 0905   MONOABS 0.4 02/17/2019 0905   EOSABS 0.2 02/17/2019 0905   BASOSABS 0.0 02/17/2019 0905   Iron/TIBC/Ferritin/ %Sat No results found for: IRON, TIBC, FERRITIN, IRONPCTSAT Lipid Panel     Component Value Date/Time   CHOL 148 02/17/2019 0905   CHOL 150 11/01/2018 0809   TRIG 47.0 02/17/2019 0905   HDL 45.10 02/17/2019 0905   HDL 42 11/01/2018 0809   CHOLHDL 3 02/17/2019 0905   VLDL 9.4 02/17/2019 0905   LDLCALC 93 02/17/2019 0905   LDLCALC 89 11/01/2018  0809   Hepatic Function Panel     Component Value Date/Time   PROT 6.1 02/17/2019 0905   PROT 6.4 11/01/2018 0809   ALBUMIN 4.0 02/17/2019 0905   ALBUMIN 4.2 11/01/2018 0809   AST 12 02/17/2019 0905   ALT 12 02/17/2019 0905   ALKPHOS 93 02/17/2019 0905   BILITOT 0.5 02/17/2019 0905   BILITOT 0.3 11/01/2018 0809   BILIDIR 0.0 03/03/2013 1033      Component Value  Date/Time   TSH 2.91 02/17/2019 0905   TSH 1.800 01/09/2018 1157   TSH 2.09 10/10/2016 0919   Results for [name], Meleni "[name]" (MRN [id number]) as of 02/26/2019 14:28  Ref. Range 02/17/2019 09:05  VITD Latest Ref Range: 30.00 - 100.00 ng/mL 49.04   OBESITY BEHAVIORAL INTERVENTION VISIT  Today's visit was #16  Starting weight: 253 lbs Starting date: 01/09/2018 Today's weight: 166 lbs Today's date: 02/25/2019 Total lbs lost to date: 87    02/25/2019  Height 5\' 4"  (1.626 m)  Weight 166 lb (75.3 kg)  BMI (Calculated) 28.48  BLOOD PRESSURE - SYSTOLIC [id number]  BLOOD PRESSURE - DIASTOLIC 75   Body Fat % 123456 %   ASK: We discussed the diagnosis of obesity with Caren Griffins [name] today and Edy agreed to give [place] permission to discuss obesity behavioral modification therapy today.  ASSESS: Georgi has the diagnosis of obesity and her BMI today is 28.6. Sophee is in the action stage of change.   ADVISE: Talene was educated on the multiple health risks of obesity as well as the benefit of weight loss to improve her health. She was advised of the need for long term treatment and the importance of lifestyle modifications to improve her current health and to decrease her risk of future health problems.  AGREE: Multiple dietary modification options and treatment options were discussed and  Laelyn agreed to follow the recommendations documented in the above note.  ARRANGE: Kazaria was educated on the importance of frequent visits to treat obesity as outlined per CMS and USPSTF guidelines and  agreed to schedule her next follow up appointment today.  Migdalia Dk, am acting as transcriptionist for Abby Potash, PA-C I, Abby Potash, PA-C have reviewed above note and agree with its content

## 2019-03-06 DIAGNOSIS — H903 Sensorineural hearing loss, bilateral: Secondary | ICD-10-CM | POA: Diagnosis not present

## 2019-03-06 DIAGNOSIS — H9313 Tinnitus, bilateral: Secondary | ICD-10-CM | POA: Diagnosis not present

## 2019-03-06 DIAGNOSIS — Z7289 Other problems related to lifestyle: Secondary | ICD-10-CM | POA: Diagnosis not present

## 2019-03-25 ENCOUNTER — Other Ambulatory Visit: Payer: Self-pay

## 2019-03-25 ENCOUNTER — Encounter (INDEPENDENT_AMBULATORY_CARE_PROVIDER_SITE_OTHER): Payer: Self-pay | Admitting: Physician Assistant

## 2019-03-25 ENCOUNTER — Ambulatory Visit (INDEPENDENT_AMBULATORY_CARE_PROVIDER_SITE_OTHER): Payer: BC Managed Care – PPO | Admitting: Physician Assistant

## 2019-03-25 VITALS — BP 119/74 | HR 71 | Temp 98.2°F | Ht 64.0 in | Wt 163.0 lb

## 2019-03-25 DIAGNOSIS — Z6830 Body mass index (BMI) 30.0-30.9, adult: Secondary | ICD-10-CM | POA: Diagnosis not present

## 2019-03-25 DIAGNOSIS — E669 Obesity, unspecified: Secondary | ICD-10-CM

## 2019-03-25 DIAGNOSIS — E559 Vitamin D deficiency, unspecified: Secondary | ICD-10-CM | POA: Diagnosis not present

## 2019-03-26 NOTE — Progress Notes (Signed)
Office: [phone]  /  Fax: [phone]   HPI:   Chief Complaint: OBESITY Judith [name] is here to discuss her progress with her obesity treatment plan. She is on the Category 2 plan and is following her eating plan approximately 75 % of the time. She states she is exercising 0 minutes 0 times per week. Judith [name] is almost at her maintenance weight. She reports an increase in cravings, but not hunger. She would like more ideas for dinner. Her weight is 163 lb (73.9 kg) today and has had a weight loss of 3 pounds over a period of 4 weeks since her last visit. She has lost 90 lbs since starting treatment with [place].  Vitamin D deficiency Judith [name] has a diagnosis of vitamin D deficiency. Judith [name] is currently taking OTC vit D and denies nausea, vomiting or muscle weakness.  ASSESSMENT AND PLAN:  Vitamin D deficiency  Class 1 obesity with serious comorbidity and body mass index (BMI) of 30.0 to 30.9 in adult, unspecified obesity type - Starting BMI greater then 30  PLAN:  Vitamin D Deficiency Judith [name] was informed that low vitamin D levels contributes to fatigue and are associated with obesity, breast, and colon cancer. Judith [name] will continue to take OTC Vit D @50 ,000 IU daily and she will follow up for routine testing of vitamin D, at least 2-3 times per year. She was informed of the risk of over-replacement of vitamin D and agrees to not increase her dose unless she discusses this with [place] first.  I spent > than 50% of the 18 minute visit on counseling as documented in the note.  Obesity Judith [name] is currently in the action stage of change. As such, her goal is to continue with weight loss efforts She has agreed to keep a food journal with 400 to 500 calories and 35 grams of protein at supper daily and follow the Category 2 plan Judith [name] has been instructed to work up to a goal of 150 minutes of combined cardio and strengthening exercise per week for weight loss and overall health benefits. We  discussed the following Behavioral Modification Strategies today: keeping healthy foods in the home and work on meal planning and easy cooking plans  We will check indirect calorimeter at the next visit.  Judith [name] has agreed to follow up with our clinic in 4 to 5 weeks. She was informed of the importance of frequent follow up visits to maximize her success with intensive lifestyle modifications for her multiple health conditions.  ALLERGIES: No Known Allergies  MEDICATIONS: Current Outpatient Medications on File Prior to Visit  Medication Sig Dispense Refill   Calcium-Magnesium-Vitamin D (CALCIUM 1200+D3 PO) Take 1 tablet by mouth daily. 1000U vit d     Cholecalciferol (VITAMIN D3) 125 MCG (5000 UT) CAPS Take 5,000 Units by mouth daily.     Omega-3 Fatty Acids (FISH OIL) 1200 MG CAPS Take 1 capsule by mouth daily.     vitamin C (ASCORBIC ACID) 500 MG tablet Take 500 mg by mouth daily.     No current facility-administered medications on file prior to visit.     PAST MEDICAL HISTORY: Past Medical History:  Diagnosis Date   Obesity     PAST SURGICAL HISTORY: History reviewed. No pertinent surgical history.  SOCIAL HISTORY: Social History   Tobacco Use   Smoking status: Former Smoker    Packs/day: 0.20    Years: 5.00    Pack years: 1.00    Types: Cigarettes    Quit date: 09/09/1986  Years since quitting: 32.5   Smokeless tobacco: Former Systems developer    Quit date: 02/16/1985  Substance Use Topics   Alcohol use: Yes    Alcohol/week: 14.0 standard drinks    Types: 14 Glasses of wine per week   Drug use: No    FAMILY HISTORY: Family History  Problem Relation Age of Onset   Cancer Mother 46       lymphoma   Cancer Father        renal 1969, 1999   Arthritis Father     ROS: Review of Systems  Constitutional: Positive for weight loss.  Gastrointestinal: Negative for nausea and vomiting.  Musculoskeletal:       Negative for muscle weakness    PHYSICAL  EXAM: Blood pressure 119/74, pulse 71, temperature 98.2 F (36.8 C), temperature source Oral, height 5\' 4"  (1.626 m), weight 163 lb (73.9 kg), SpO2 98 %. Body mass index is 27.98 kg/m. Physical Exam Vitals signs reviewed.  Constitutional:      Appearance: Normal appearance. She is well-developed. She is obese.  Cardiovascular:     Rate and Rhythm: Normal rate.  Pulmonary:     Effort: Pulmonary effort is normal.  Musculoskeletal: Normal range of motion.  Skin:    General: Skin is warm and dry.  Neurological:     Mental Status: She is alert and oriented to person, place, and time.  Psychiatric:        Mood and Affect: Mood normal.        Behavior: Behavior normal.     RECENT LABS AND TESTS: BMET    Component Value Date/Time   NA 141 02/17/2019 0905   NA 142 11/01/2018 0809   K 4.4 02/17/2019 0905   CL 106 02/17/2019 0905   CO2 29 02/17/2019 0905   GLUCOSE 86 02/17/2019 0905   BUN 13 02/17/2019 0905   BUN 14 11/01/2018 0809   CREATININE 0.69 02/17/2019 0905   CALCIUM 9.6 02/17/2019 0905   GFRNONAA 96 11/01/2018 0809   GFRAA 111 11/01/2018 0809   Lab Results  Component Value Date   HGBA1C 5.1 11/01/2018   HGBA1C 5.2 05/06/2018   HGBA1C 5.2 10/12/2017   HGBA1C 5.2 01/18/2017   Lab Results  Component Value Date   INSULIN 3.4 11/01/2018   INSULIN 6.4 05/06/2018   INSULIN 10.0 01/09/2018   CBC    Component Value Date/Time   WBC 5.8 02/17/2019 0905   RBC 4.71 02/17/2019 0905   HGB 14.5 02/17/2019 0905   HCT 42.9 02/17/2019 0905   PLT 294.0 02/17/2019 0905   MCV 91.1 02/17/2019 0905   MCHC 33.7 02/17/2019 0905   RDW 13.7 02/17/2019 0905   LYMPHSABS 2.3 02/17/2019 0905   MONOABS 0.4 02/17/2019 0905   EOSABS 0.2 02/17/2019 0905   BASOSABS 0.0 02/17/2019 0905   Iron/TIBC/Ferritin/ %Sat No results found for: IRON, TIBC, FERRITIN, IRONPCTSAT Lipid Panel     Component Value Date/Time   CHOL 148 02/17/2019 0905   CHOL 150 11/01/2018 0809   TRIG 47.0  02/17/2019 0905   HDL 45.10 02/17/2019 0905   HDL 42 11/01/2018 0809   CHOLHDL 3 02/17/2019 0905   VLDL 9.4 02/17/2019 0905   LDLCALC 93 02/17/2019 0905   LDLCALC 89 11/01/2018 0809   Hepatic Function Panel     Component Value Date/Time   PROT 6.1 02/17/2019 0905   PROT 6.4 11/01/2018 0809   ALBUMIN 4.0 02/17/2019 0905   ALBUMIN 4.2 11/01/2018 0809   AST 12 02/17/2019 0905  ALT 12 02/17/2019 0905   ALKPHOS 93 02/17/2019 0905   BILITOT 0.5 02/17/2019 0905   BILITOT 0.3 11/01/2018 0809   BILIDIR 0.0 03/03/2013 1033      Component Value Date/Time   TSH 2.91 02/17/2019 0905   TSH 1.800 01/09/2018 1157   TSH 2.09 10/10/2016 0919     Ref. Range 02/17/2019 09:05  VITD Latest Ref Range: 30.00 - 100.00 ng/mL 49.04    OBESITY BEHAVIORAL INTERVENTION VISIT  Today's visit was # 17   Starting weight: 253 lbs Starting date: 01/09/2018 Today's weight : 163 lbs Today's date: 03/25/2019 Total lbs lost to date: 90    03/25/2019  Height 5\' 4"  (1.626 m)  Weight 163 lb (73.9 kg)  BMI (Calculated) 27.97  BLOOD PRESSURE - SYSTOLIC [id number]  BLOOD PRESSURE - DIASTOLIC 74   Body Fat % [id number] %  Total Body Water (lbs) 74 lbs    ASK: We discussed the diagnosis of obesity with Caren Griffins [name] today and Randell agreed to give [place] permission to discuss obesity behavioral modification therapy today.  ASSESS: Trent has the diagnosis of obesity and her BMI today is 27.97 Jamyiah is in the action stage of change   ADVISE: Velva was educated on the multiple health risks of obesity as well as the benefit of weight loss to improve her health. She was advised of the need for long term treatment and the importance of lifestyle modifications to improve her current health and to decrease her risk of future health problems.  AGREE: Multiple dietary modification options and treatment options were discussed and  Bethan agreed to follow the recommendations documented in the above  note.  ARRANGE: Tonicia was educated on the importance of frequent visits to treat obesity as outlined per CMS and USPSTF guidelines and agreed to schedule her next follow up appointment today.  Corey Skains, am acting as transcriptionist for Abby Potash, PA-C I, Abby Potash, PA-C have reviewed above note and agree with its content

## 2019-04-22 ENCOUNTER — Encounter (INDEPENDENT_AMBULATORY_CARE_PROVIDER_SITE_OTHER): Payer: Self-pay

## 2019-04-23 ENCOUNTER — Encounter (INDEPENDENT_AMBULATORY_CARE_PROVIDER_SITE_OTHER): Payer: Self-pay | Admitting: Physician Assistant

## 2019-04-23 ENCOUNTER — Telehealth (INDEPENDENT_AMBULATORY_CARE_PROVIDER_SITE_OTHER): Payer: BC Managed Care – PPO | Admitting: Physician Assistant

## 2019-04-23 ENCOUNTER — Other Ambulatory Visit: Payer: Self-pay

## 2019-04-23 DIAGNOSIS — E7849 Other hyperlipidemia: Secondary | ICD-10-CM | POA: Diagnosis not present

## 2019-04-23 DIAGNOSIS — Z6830 Body mass index (BMI) 30.0-30.9, adult: Secondary | ICD-10-CM

## 2019-04-23 DIAGNOSIS — E669 Obesity, unspecified: Secondary | ICD-10-CM

## 2019-04-23 DIAGNOSIS — E66811 Obesity, class 1: Secondary | ICD-10-CM

## 2019-04-28 NOTE — Progress Notes (Signed)
Office: [phone]  /  Fax: [phone] TeleHealth Visit:  Judith [name] has verbally consented to this TeleHealth visit today. The patient is located at work, the provider is located at the [employer] [facility] office. The participants in this visit include the listed provider and patient and any and all parties involved. The visit was conducted today via WebEx.  HPI:  Chief Complaint: OBESITY Judith [name] is here to discuss her progress with her obesity treatment plan. She is on the Category 2 plan and states she is following her eating plan approximately 70 % of the time. She states she is exercising 0 minutes 0 times per week.  Judith [name]'s most recent weight is 165 pounds (04/23/19). She reports some increased stress over the last few weeks and she has not followed the plan as close. Her hunger is controlled and she states it has not been an issue.  Hyperlipidemia Judith [name] has hyperlipidemia and she is on Omega 3's. She has no chest pain. Judith [name] will be due for labs at her next visit. Her last labs were within normal range.   ASSESSMENT AND PLAN:  Other hyperlipidemia  Class 1 obesity with serious comorbidity and body mass index (BMI) of 30.0 to 30.9 in adult, unspecified obesity type  PLAN:  Hyperlipidemia Intensive lifestyle modifications as the first line treatment for hyperlipidemia. We discussed many lifestyle modifications today and Judith [name] will continue with Omega 3's and follow up as directed. She will continue to with the meal plan.   Obesity Judith [name] is currently in the action stage of change. As such, her goal is to continue with weight loss efforts She has agreed to follow the Category 2 plan Judith [name] has been instructed to work up to a goal of 150 minutes of combined cardio and strengthening exercise per week for weight loss and overall health benefits. We discussed the following Behavioral Modification Strategies today: planning for success and work on  meal planning and easy cooking plans  We will check indirect calorimetry at the next visit as planned.  Judith [name] has agreed to follow up with our clinic in 4 weeks. She was informed of the importance of frequent follow up visits to maximize her success with intensive lifestyle modifications for her multiple health conditions.  I spent > than 50% of the 22 minute visit on counseling as documented in the note.    ALLERGIES: No Known Allergies  MEDICATIONS: Current Outpatient Medications on File Prior to Visit  Medication Sig Dispense Refill   Calcium-Magnesium-Vitamin D (CALCIUM 1200+D3 PO) Take 1 tablet by mouth daily. 1000U vit d     Cholecalciferol (VITAMIN D3) 125 MCG (5000 UT) CAPS Take 5,000 Units by mouth daily.     Omega-3 Fatty Acids (FISH OIL) 1200 MG CAPS Take 1 capsule by mouth daily.     vitamin C (ASCORBIC ACID) 500 MG tablet Take 500 mg by mouth daily.     No current facility-administered medications on file prior to visit.    PAST MEDICAL HISTORY: Past Medical History:  Diagnosis Date   Obesity     PAST SURGICAL HISTORY: History reviewed. No pertinent surgical history.  SOCIAL HISTORY: Social History   Tobacco Use   Smoking status: Former Smoker    Packs/day: 0.20    Years: 5.00    Pack years: 1.00    Types: Cigarettes    Quit date: 09/09/1986    Years since quitting: 32.6   Smokeless tobacco: Former Systems developer    Quit date: 02/16/1985  Substance Use Topics  Alcohol use: Yes    Alcohol/week: 14.0 standard drinks    Types: 14 Glasses of wine per week   Drug use: No    FAMILY HISTORY: Family History  Problem Relation Age of Onset   Cancer Mother 42       lymphoma   Cancer Father        renal 1969, 1999   Arthritis Father     ROS: Review of Systems  Constitutional: Negative for weight loss.  Cardiovascular: Negative for chest pain.    PHYSICAL EXAM: There were no vitals taken for this visit. There is no height or weight on file to  calculate BMI. Physical Exam Vitals reviewed.  Constitutional:      General: She is not in acute distress.    Appearance: Normal appearance. She is well-developed. She is obese.  Cardiovascular:     Rate and Rhythm: Normal rate.  Pulmonary:     Effort: Pulmonary effort is normal.  Musculoskeletal:        General: Normal range of motion.  Skin:    General: Skin is warm and dry.  Neurological:     Mental Status: She is alert and oriented to person, place, and time.  Psychiatric:        Mood and Affect: Mood normal.        Behavior: Behavior normal.     RECENT LABS AND TESTS: BMET    Component Value Date/Time   NA 141 02/17/2019 0905   NA 142 11/01/2018 0809   K 4.4 02/17/2019 0905   CL 106 02/17/2019 0905   CO2 29 02/17/2019 0905   GLUCOSE 86 02/17/2019 0905   BUN 13 02/17/2019 0905   BUN 14 11/01/2018 0809   CREATININE 0.69 02/17/2019 0905   CALCIUM 9.6 02/17/2019 0905   GFRNONAA 96 11/01/2018 0809   GFRAA 111 11/01/2018 0809   Lab Results  Component Value Date   HGBA1C 5.1 11/01/2018   HGBA1C 5.2 05/06/2018   HGBA1C 5.2 10/12/2017   HGBA1C 5.2 01/18/2017   Lab Results  Component Value Date   INSULIN 3.4 11/01/2018   INSULIN 6.4 05/06/2018   INSULIN 10.0 01/09/2018   CBC    Component Value Date/Time   WBC 5.8 02/17/2019 0905   RBC 4.71 02/17/2019 0905   HGB 14.5 02/17/2019 0905   HCT 42.9 02/17/2019 0905   PLT 294.0 02/17/2019 0905   MCV 91.1 02/17/2019 0905   MCHC 33.7 02/17/2019 0905   RDW 13.7 02/17/2019 0905   LYMPHSABS 2.3 02/17/2019 0905   MONOABS 0.4 02/17/2019 0905   EOSABS 0.2 02/17/2019 0905   BASOSABS 0.0 02/17/2019 0905   Iron/TIBC/Ferritin/ %Sat No results found for: IRON, TIBC, FERRITIN, IRONPCTSAT Lipid Panel     Component Value Date/Time   CHOL 148 02/17/2019 0905   CHOL 150 11/01/2018 0809   TRIG 47.0 02/17/2019 0905   HDL 45.10 02/17/2019 0905   HDL 42 11/01/2018 0809   CHOLHDL 3 02/17/2019 0905   VLDL 9.4 02/17/2019 0905    LDLCALC 93 02/17/2019 0905   LDLCALC 89 11/01/2018 0809   Hepatic Function Panel     Component Value Date/Time   PROT 6.1 02/17/2019 0905   PROT 6.4 11/01/2018 0809   ALBUMIN 4.0 02/17/2019 0905   ALBUMIN 4.2 11/01/2018 0809   AST 12 02/17/2019 0905   ALT 12 02/17/2019 0905   ALKPHOS 93 02/17/2019 0905   BILITOT 0.5 02/17/2019 0905   BILITOT 0.3 11/01/2018 0809   BILIDIR 0.0 03/03/2013 1033  Component Value Date/Time   TSH 2.91 02/17/2019 0905   TSH 1.800 01/09/2018 1157   TSH 2.09 10/10/2016 0919     Ref. Range 02/17/2019 09:05  VITD Latest Ref Range: 30.00 - 100.00 ng/mL 49.04    I, Doreene Nest, am acting as Location manager for Abby Potash, PA-C I, Abby Potash, PA-C have reviewed above note and agree with its content

## 2019-05-21 ENCOUNTER — Ambulatory Visit (INDEPENDENT_AMBULATORY_CARE_PROVIDER_SITE_OTHER): Payer: BC Managed Care – PPO | Admitting: Physician Assistant

## 2019-05-21 ENCOUNTER — Encounter (INDEPENDENT_AMBULATORY_CARE_PROVIDER_SITE_OTHER): Payer: Self-pay | Admitting: Physician Assistant

## 2019-05-21 ENCOUNTER — Other Ambulatory Visit: Payer: Self-pay

## 2019-05-21 VITALS — BP 95/64 | HR 61 | Temp 98.2°F | Ht 64.0 in | Wt 162.0 lb

## 2019-05-21 DIAGNOSIS — Z9189 Other specified personal risk factors, not elsewhere classified: Secondary | ICD-10-CM | POA: Diagnosis not present

## 2019-05-21 DIAGNOSIS — E669 Obesity, unspecified: Secondary | ICD-10-CM

## 2019-05-21 DIAGNOSIS — E8881 Metabolic syndrome: Secondary | ICD-10-CM | POA: Diagnosis not present

## 2019-05-21 DIAGNOSIS — R0602 Shortness of breath: Secondary | ICD-10-CM | POA: Diagnosis not present

## 2019-05-21 DIAGNOSIS — E559 Vitamin D deficiency, unspecified: Secondary | ICD-10-CM

## 2019-05-21 DIAGNOSIS — Z6830 Body mass index (BMI) 30.0-30.9, adult: Secondary | ICD-10-CM

## 2019-05-22 LAB — COMPREHENSIVE METABOLIC PANEL
ALT: 13 IU/L (ref 0–32)
AST: 15 IU/L (ref 0–40)
Albumin/Globulin Ratio: 1.9 (ref 1.2–2.2)
Albumin: 4.1 g/dL (ref 3.8–4.8)
Alkaline Phosphatase: 93 IU/L (ref 39–117)
BUN/Creatinine Ratio: 19 (ref 12–28)
BUN: 14 mg/dL (ref 8–27)
Bilirubin Total: 0.4 mg/dL (ref 0.0–1.2)
CO2: 25 mmol/L (ref 20–29)
Calcium: 10.1 mg/dL (ref 8.7–10.3)
Chloride: 106 mmol/L (ref 96–106)
Creatinine, Ser: 0.75 mg/dL (ref 0.57–1.00)
GFR calc Af Amer: 99 mL/min/{1.73_m2} (ref >59)
GFR calc non Af Amer: 86 mL/min/{1.73_m2} (ref >59)
Globulin, Total: 2.2 g/dL (ref 1.5–4.5)
Glucose: 83 mg/dL (ref 65–99)
Potassium: 5.3 mmol/L — ABNORMAL HIGH (ref 3.5–5.2)
Sodium: 142 mmol/L (ref 134–144)
Total Protein: 6.3 g/dL (ref 6.0–8.5)

## 2019-05-22 LAB — INSULIN, RANDOM: INSULIN: 33 u[IU]/mL — ABNORMAL HIGH (ref 2.6–24.9)

## 2019-05-22 LAB — HEMOGLOBIN A1C
Est. average glucose Bld gHb Est-mCnc: 88 mg/dL
Hgb A1c MFr Bld: 4.7 % — ABNORMAL LOW (ref 4.8–5.6)

## 2019-05-22 LAB — VITAMIN D 25 HYDROXY (VIT D DEFICIENCY, FRACTURES): Vit D, 25-Hydroxy: 52.2 ng/mL (ref 30.0–100.0)

## 2019-05-25 NOTE — Progress Notes (Signed)
Chief Complaint:   OBESITY Judith [name] is here to discuss her progress with her obesity treatment plan along with follow-up of her obesity related diagnoses. [name] is on the Category 2 Plan and states she is following her eating plan approximately 67% of the time. [name] states she is exercising 0 minutes 0 times per week.  Today's visit was #: 82 Starting weight: 253 lbs Starting date: 01/09/2018 Today's weight: 162 lbs Today's date: 05/21/2019 Total lbs lost to date: 91 Total lbs lost since last in-office visit: 1  Interim History: [name] states that her hunger is well controlled. She has no joint pain, and she thinks that she is okay with her weight, as being her maintenance weight. She does not want to exercise.  Subjective:   Vitamin D deficiency  [name]'s Vitamin D level was 49.04 on 02/17/19. She is currently taking OTC vit D 5,000 units daily. She denies nausea, vomiting or muscle weakness. Ange is due for labs.  Shortness of breath on exertion [name] notes shortness of breath with exertion. She notes getting out of breath sooner with activity than she used to. [name] denies dizziness or lightheadedness. We will check indirect calorimetry today.  Insulin resistance  [name] has a diagnosis of insulin resistance based on her elevated fasting insulin level >5. He last insulin level was at 3.4 (11/01/18). She continues to work on diet to decrease her risk of diabetes. [name] does not want to exercise. [name] denies polyphagia. Labs were discussed with patient today.  Lab Results  Component Value Date   INSULIN 33.0 (H) 05/21/2019   INSULIN 3.4 11/01/2018   INSULIN 6.4 05/06/2018   INSULIN 10.0 01/09/2018   Lab Results  Component Value Date   HGBA1C 4.7 (L) 05/21/2019   At risk for osteoporosis  Tyeshia is at a higher than average risk for cardiovascular disease due to obesity and vitamin D deficiency. Reviewed: no chest pain on exertion, no  dyspnea on exertion, and no swelling of ankles.  Assessment/Plan:   Vitamin D deficiency Low Vitamin D level contributes to fatigue and are associated with obesity, breast, and colon cancer. Dennys will continue with OTC vitamin D 5,000 IU daily and we will check labs. Verva will follow-up for routine testing of Vitamin D, at least 2-3 times per year to avoid over-replacement.   Shortness of breath on exertion [name] does feel that she gets out of breath more easily that she used to when she exercises. [name]'s shortness of breath appears to be obesity related and exercise induced. She has agreed to work on weight loss and gradually increase exercise to treat her exercise induced shortness of breath. Indirect Calorimeter done today shows a VO2 of 222 and a REE of 1547 (RMR 1304 01/09/18) .Will continue to monitor closely.  Insulin resistance  Deyaneira will continue with the plan and she will continue to work on weight loss and decreasing simple carbohydrates to help decrease the risk of diabetes. [name] agreed to follow-up with [place] as directed to closely monitor her progress.  At risk for osteoporosis [name] was given approximately 15 minutes of coronary artery disease prevention counseling today. She is 64 y.o. female and has risk factors for heart disease including obesity and vitamin D deficiency. We discussed intensive lifestyle modifications today with an emphasis on specific weight loss instructions and strategies.   Obesity [name] is currently in the action stage of change. As such, her goal is to continue with weight loss efforts. She has agreed to  on the Category 2 Plan +500 to 100 protein calories.   Exercise goals: For substantial health benefits, adults should do at least 150 minutes (2 hours and 30 minutes) a week of moderate-intensity, or 75 minutes (1 hour and 15 minutes) a week of vigorous-intensity aerobic physical activity, or an equivalent combination of moderate- and  vigorous-intensity aerobic activity. Aerobic activity should be performed in episodes of at least 10 minutes, and preferably, it should be spread throughout the week. Adults should also include muscle-strengthening activities that involve all major muscle groups on 2 or more days a week.  Behavioral modification strategies: meal planning and cooking strategies and planning for success.  [name] has agreed to follow-up with our clinic in 4 weeks. She was informed of the importance of frequent follow-up visits to maximize her success with intensive lifestyle modifications for her multiple health conditions.   [name] was informed we would discuss her lab results at her next visit unless there is a critical issue that needs to be addressed sooner. [name] agreed to keep her next visit at the agreed upon time to discuss these results.  Objective:   Blood pressure 95/64, pulse 61, temperature 98.2 F (36.8 C), temperature source Oral, height 5\' 4"  (1.626 m), weight 162 lb (73.5 kg), SpO2 99 %. Body mass index is 27.81 kg/m.  General: Cooperative, alert, well developed, in no acute distress. HEENT: Conjunctivae and lids unremarkable. Cardiovascular: Regular rhythm.  Lungs: Normal work of breathing. Neurologic: No focal deficits.   Lab Results  Component Value Date   CREATININE 0.75 05/21/2019   BUN 14 05/21/2019   NA 142 05/21/2019   K 5.3 (H) 05/21/2019   CL 106 05/21/2019   CO2 25 05/21/2019   Lab Results  Component Value Date   ALT 13 05/21/2019   AST 15 05/21/2019   ALKPHOS 93 05/21/2019   BILITOT 0.4 05/21/2019   Lab Results  Component Value Date   HGBA1C 4.7 (L) 05/21/2019   HGBA1C 5.1 11/01/2018   HGBA1C 5.2 05/06/2018   HGBA1C 5.2 10/12/2017   HGBA1C 5.2 01/18/2017   Lab Results  Component Value Date   INSULIN 33.0 (H) 05/21/2019   INSULIN 3.4 11/01/2018   INSULIN 6.4 05/06/2018   INSULIN 10.0 01/09/2018   Lab Results  Component Value Date   TSH 2.91 02/17/2019    Lab Results  Component Value Date   CHOL 148 02/17/2019   HDL 45.10 02/17/2019   LDLCALC 93 02/17/2019   TRIG 47.0 02/17/2019   CHOLHDL 3 02/17/2019   Lab Results  Component Value Date   WBC 5.8 02/17/2019   HGB 14.5 02/17/2019   HCT 42.9 02/17/2019   MCV 91.1 02/17/2019   PLT 294.0 02/17/2019   No results found for: IRON, TIBC, FERRITIN   Ref. Range 02/17/2019 09:05  VITD Latest Ref Range: 30.00 - 100.00 ng/mL 49.04    Attestation Statements:   Reviewed by clinician on day of visit: allergies, medications, problem list, medical history, surgical history, family history, social history, and previous encounter notes.  Corey Skains, am acting as Location manager for [employer], PA-C.  I have reviewed the above documentation for accuracy and completeness, and I agree with the above. Abby Potash, PA-C

## 2019-06-18 ENCOUNTER — Ambulatory Visit (INDEPENDENT_AMBULATORY_CARE_PROVIDER_SITE_OTHER): Payer: BC Managed Care – PPO | Admitting: Physician Assistant

## 2019-06-30 ENCOUNTER — Encounter (INDEPENDENT_AMBULATORY_CARE_PROVIDER_SITE_OTHER): Payer: Self-pay | Admitting: Physician Assistant

## 2019-06-30 ENCOUNTER — Other Ambulatory Visit: Payer: Self-pay

## 2019-06-30 ENCOUNTER — Ambulatory Visit (INDEPENDENT_AMBULATORY_CARE_PROVIDER_SITE_OTHER): Payer: BC Managed Care – PPO | Admitting: Physician Assistant

## 2019-06-30 VITALS — BP 113/75 | HR 58 | Temp 97.5°F | Ht 64.0 in | Wt 163.0 lb

## 2019-06-30 DIAGNOSIS — E559 Vitamin D deficiency, unspecified: Secondary | ICD-10-CM | POA: Diagnosis not present

## 2019-06-30 DIAGNOSIS — Z6830 Body mass index (BMI) 30.0-30.9, adult: Secondary | ICD-10-CM | POA: Diagnosis not present

## 2019-06-30 DIAGNOSIS — E669 Obesity, unspecified: Secondary | ICD-10-CM | POA: Diagnosis not present

## 2019-06-30 DIAGNOSIS — E66811 Obesity, class 1: Secondary | ICD-10-CM

## 2019-06-30 NOTE — Progress Notes (Signed)
Chief Complaint:   OBESITY Judith [name] is here to discuss her progress with her obesity treatment plan along with follow-up of her obesity related diagnoses. Judith [name] is on the Category 2 Plan and states she is following her eating plan approximately 85% of the time. Judith [name] states she is exercising for 0 minutes 0 times per week.  Today's visit was #: 20 Starting weight: 253 lbs Starting date: 01/09/2018 Today's weight: 163 lbs Today's date: 06/30/2019 Total lbs lost to date: 90 lbs Total lbs lost since last in-office visit: 0  Interim History: Judith [name] states that she has had no issue with the plan over the last few weeks.  She notes a few cravings here and there but otherwise, her hunger is controlled.  Subjective:   1. Vitamin D deficiency Judith [name]'s Vitamin D level was 52.2 on 05/21/2019. She is currently taking vit D. She denies nausea, vomiting or muscle weakness.  Assessment/Plan:   1. Vitamin D deficiency Low Vitamin D level contributes to fatigue and are associated with obesity, breast, and colon cancer. She agrees to continue to take prescription Vitamin D @5 ,000 IU daily and will follow-up for routine testing of Vitamin D, at least 2-3 times per year to avoid over-replacement.  2. Class 1 obesity with serious comorbidity and body mass index (BMI) of 30.0 to 30.9 in adult, unspecified obesity type Judith [name] is currently in the action stage of change. As such, her goal is to continue with weight loss efforts. She has agreed to the Category 2 Plan.   Exercise goals: For substantial health benefits, adults should do at least 150 minutes (2 hours and 30 minutes) a week of moderate-intensity, or 75 minutes (1 hour and 15 minutes) a week of vigorous-intensity aerobic physical activity, or an equivalent combination of moderate- and vigorous-intensity aerobic activity. Aerobic activity should be performed in episodes of at least 10 minutes, and preferably, it should be spread throughout the  week.  Behavioral modification strategies: meal planning and cooking strategies and keeping healthy foods in the home.  Judith [name] has agreed to follow-up with our clinic in 6 weeks. She was informed of the importance of frequent follow-up visits to maximize her success with intensive lifestyle modifications for her multiple health conditions.   Objective:   Blood pressure 113/75, pulse (!) 58, temperature (!) 97.5 F (36.4 C), temperature source Oral, height 5\' 4"  (1.626 m), weight 163 lb (73.9 kg), SpO2 100 %. Body mass index is 27.98 kg/m.  General: Cooperative, alert, well developed, in no acute distress. HEENT: Conjunctivae and lids unremarkable. Cardiovascular: Regular rhythm.  Lungs: Normal work of breathing. Neurologic: No focal deficits.   Lab Results  Component Value Date   CREATININE 0.75 05/21/2019   BUN 14 05/21/2019   NA 142 05/21/2019   K 5.3 (H) 05/21/2019   CL 106 05/21/2019   CO2 25 05/21/2019   Lab Results  Component Value Date   ALT 13 05/21/2019   AST 15 05/21/2019   ALKPHOS 93 05/21/2019   BILITOT 0.4 05/21/2019   Lab Results  Component Value Date   HGBA1C 4.7 (L) 05/21/2019   HGBA1C 5.1 11/01/2018   HGBA1C 5.2 05/06/2018   HGBA1C 5.2 10/12/2017   HGBA1C 5.2 01/18/2017   Lab Results  Component Value Date   INSULIN 33.0 (H) 05/21/2019   INSULIN 3.4 11/01/2018   INSULIN 6.4 05/06/2018   INSULIN 10.0 01/09/2018   Lab Results  Component Value Date   TSH 2.91 02/17/2019   Lab Results  Component  Value Date   CHOL 148 02/17/2019   HDL 45.10 02/17/2019   LDLCALC 93 02/17/2019   TRIG 47.0 02/17/2019   CHOLHDL 3 02/17/2019   Lab Results  Component Value Date   WBC 5.8 02/17/2019   HGB 14.5 02/17/2019   HCT 42.9 02/17/2019   MCV 91.1 02/17/2019   PLT 294.0 02/17/2019   Attestation Statements:   Reviewed by clinician on day of visit: allergies, medications, problem list, medical history, surgical history, family history, social history,  and previous encounter notes.  Time spent on visit including pre-visit chart review and post-visit care was 24 minutes.   I, Water quality scientist, CMA, am acting as Location manager for [employer], PA-C.  I have reviewed the above documentation for accuracy and completeness, and I agree with the above. Abby Potash, PA-C

## 2019-08-11 ENCOUNTER — Encounter (INDEPENDENT_AMBULATORY_CARE_PROVIDER_SITE_OTHER): Payer: Self-pay | Admitting: Physician Assistant

## 2019-08-11 ENCOUNTER — Other Ambulatory Visit: Payer: Self-pay

## 2019-08-11 ENCOUNTER — Ambulatory Visit (INDEPENDENT_AMBULATORY_CARE_PROVIDER_SITE_OTHER): Payer: BC Managed Care – PPO | Admitting: Physician Assistant

## 2019-08-11 VITALS — BP 114/76 | HR 71 | Temp 98.0°F | Ht 64.0 in | Wt 159.0 lb

## 2019-08-11 DIAGNOSIS — E8881 Metabolic syndrome: Secondary | ICD-10-CM

## 2019-08-11 DIAGNOSIS — E669 Obesity, unspecified: Secondary | ICD-10-CM

## 2019-08-11 DIAGNOSIS — Z6830 Body mass index (BMI) 30.0-30.9, adult: Secondary | ICD-10-CM

## 2019-08-11 NOTE — Progress Notes (Signed)
Chief Complaint:   OBESITY Judith [name] is here to discuss her progress with her obesity treatment plan along with follow-up of her obesity related diagnoses. Judith [name] is on the Category 2 Plan and states she is following her eating plan approximately 78% of the time. Judith [name] states she is exercising 0 minutes 0 times per week.  Today's visit was #: 21 Starting weight: 253 lbs Starting date: 01/09/2018 Today's weight: 159 lbs Today's date: 08/11/2019 Total lbs lost to date: 94 Total lbs lost since last in-office visit: 4  Interim History: Judith [name] states that the plan is going well. She splurges some on Friday or Saturday night. She is walking her dog daily and goes for a long walk once a week.  Subjective:   Insulin resistance. Judith [name] has a diagnosis of insulin resistance based on her elevated fasting insulin level >5. She continues to work on diet and exercise to decrease her risk of diabetes. Last insulin level 33.0 on 05/21/2019. Judith [name] is on no medications. No polyphagia.  Lab Results  Component Value Date   INSULIN 33.0 (H) 05/21/2019   INSULIN 3.4 11/01/2018   INSULIN 6.4 05/06/2018   INSULIN 10.0 01/09/2018   Lab Results  Component Value Date   HGBA1C 4.7 (L) 05/21/2019   Assessment/Plan:   Insulin resistance. [name] will continue to work on weight loss, exercise, and decreasing simple carbohydrates to help decrease the risk of diabetes. [name] agreed to follow-up with [place] as directed to closely monitor her progress.   Class 1 obesity with serious comorbidity and body mass index (BMI) of 30.0 to 30.9 in adult, unspecified obesity type - BMI greater than 30 at start of program.   Judith [name] is currently in the action stage of change. As such, her goal is to continue with weight loss efforts. She has agreed to the Category 2 Plan.   Exercise goals: For substantial health benefits, adults should do at least 150 minutes (2 hours and 30 minutes) a week of  moderate-intensity, or 75 minutes (1 hour and 15 minutes) a week of vigorous-intensity aerobic physical activity, or an equivalent combination of moderate- and vigorous-intensity aerobic activity. Aerobic activity should be performed in episodes of at least 10 minutes, and preferably, it should be spread throughout the week.  Behavioral modification strategies: meal planning and cooking strategies and keeping healthy foods in the home.  Judith [name] has agreed to follow-up with our clinic in 6 weeks. She was informed of the importance of frequent follow-up visits to maximize her success with intensive lifestyle modifications for her multiple health conditions.   Objective:   Blood pressure 114/76, pulse 71, temperature 98 F (36.7 C), temperature source Oral, height 5\' 4"  (1.626 m), weight 159 lb (72.1 kg), SpO2 100 %. Body mass index is 27.29 kg/m.  General: Cooperative, alert, well developed, in no acute distress. HEENT: Conjunctivae and lids unremarkable. Cardiovascular: Regular rhythm.  Lungs: Normal work of breathing. Neurologic: No focal deficits.   Lab Results  Component Value Date   CREATININE 0.75 05/21/2019   BUN 14 05/21/2019   NA 142 05/21/2019   K 5.3 (H) 05/21/2019   CL 106 05/21/2019   CO2 25 05/21/2019   Lab Results  Component Value Date   ALT 13 05/21/2019   AST 15 05/21/2019   ALKPHOS 93 05/21/2019   BILITOT 0.4 05/21/2019   Lab Results  Component Value Date   HGBA1C 4.7 (L) 05/21/2019   HGBA1C 5.1 11/01/2018   HGBA1C 5.2 05/06/2018   HGBA1C  5.2 10/12/2017   HGBA1C 5.2 01/18/2017   Lab Results  Component Value Date   INSULIN 33.0 (H) 05/21/2019   INSULIN 3.4 11/01/2018   INSULIN 6.4 05/06/2018   INSULIN 10.0 01/09/2018   Lab Results  Component Value Date   TSH 2.91 02/17/2019   Lab Results  Component Value Date   CHOL 148 02/17/2019   HDL 45.10 02/17/2019   LDLCALC 93 02/17/2019   TRIG 47.0 02/17/2019   CHOLHDL 3 02/17/2019   Lab Results    Component Value Date   WBC 5.8 02/17/2019   HGB 14.5 02/17/2019   HCT 42.9 02/17/2019   MCV 91.1 02/17/2019   PLT 294.0 02/17/2019   No results found for: IRON, TIBC, FERRITIN  Attestation Statements:   Reviewed by clinician on day of visit: allergies, medications, problem list, medical history, surgical history, family history, social history, and previous encounter notes.  Time spent on visit including pre-visit chart review and post-visit charting and care was 20 minutes.   IMichaelene [name], am acting as transcriptionist for Abby Potash, PA-C   I have reviewed the above documentation for accuracy and completeness, and I agree with the above. Abby Potash, PA-C

## 2019-09-23 ENCOUNTER — Ambulatory Visit (INDEPENDENT_AMBULATORY_CARE_PROVIDER_SITE_OTHER): Payer: BC Managed Care – PPO | Admitting: Physician Assistant

## 2019-10-13 ENCOUNTER — Ambulatory Visit (INDEPENDENT_AMBULATORY_CARE_PROVIDER_SITE_OTHER): Payer: BC Managed Care – PPO | Admitting: Physician Assistant

## 2019-10-16 ENCOUNTER — Ambulatory Visit (INDEPENDENT_AMBULATORY_CARE_PROVIDER_SITE_OTHER): Payer: BC Managed Care – PPO | Admitting: Physician Assistant

## 2019-11-06 ENCOUNTER — Encounter (INDEPENDENT_AMBULATORY_CARE_PROVIDER_SITE_OTHER): Payer: Self-pay | Admitting: Physician Assistant

## 2019-11-06 ENCOUNTER — Ambulatory Visit (INDEPENDENT_AMBULATORY_CARE_PROVIDER_SITE_OTHER): Payer: BC Managed Care – PPO | Admitting: Physician Assistant

## 2019-11-06 ENCOUNTER — Other Ambulatory Visit: Payer: Self-pay

## 2019-11-06 VITALS — BP 107/72 | HR 63 | Temp 98.2°F | Ht 64.0 in | Wt 156.0 lb

## 2019-11-06 DIAGNOSIS — E669 Obesity, unspecified: Secondary | ICD-10-CM

## 2019-11-06 DIAGNOSIS — E559 Vitamin D deficiency, unspecified: Secondary | ICD-10-CM | POA: Diagnosis not present

## 2019-11-06 DIAGNOSIS — Z6830 Body mass index (BMI) 30.0-30.9, adult: Secondary | ICD-10-CM

## 2019-11-06 NOTE — Progress Notes (Signed)
Chief Complaint:   OBESITY Judith [name] is here to discuss her progress with her obesity treatment plan along with follow-up of her obesity related diagnoses. Judith [name] is on the Category 2 Plan and states she is following her eating plan approximately 80% of the time. Judith [name] states she is exercising 0 minutes 0 times per week.  Today's visit was #: 22 Starting weight: 253 lbs Starting date: 01/09/2018 Today's weight: 156 lbs Today's date: 11/06/2019 Total lbs lost to date: 97 Total lbs lost since last in-office visit: 3  Interim History: Judith [name] continues to enjoy the Category 2 meal plan. Her weight tends to fluctuate between 156 and 160. She feels good and is happy with her weight and is now in maintenance.  Subjective:   Vitamin D deficiency. No nausea, vomiting, or muscle weakness on OTC Vitamin D. Judith [name] will get labs drawn with her PCP in October.   Ref. Range 05/21/2019 14:41  Vitamin D, 25-Hydroxy Latest Ref Range: 30.0 - 100.0 ng/mL 52.2   Assessment/Plan:   Vitamin D deficiency. Low Vitamin D level contributes to fatigue and are associated with obesity, breast, and colon cancer. She agrees to continue to take Vitamin D and will follow-up for routine testing of Vitamin D, at least 2-3 times per year to avoid over-replacement.  Class 1 obesity with serious comorbidity and body mass index (BMI) of 30.0 to 30.9 in adult, unspecified obesity type - starting BMI greater than 30.  Judith [name] is currently in the action stage of change. As such, her goal is to continue with weight loss efforts. She has agreed to the Category 2 Plan + 100-200 calories.  She will follow-up with her PCP for labs.   Exercise goals: For substantial health benefits, adults should do at least 150 minutes (2 hours and 30 minutes) a week of moderate-intensity, or 75 minutes (1 hour and 15 minutes) a week of vigorous-intensity aerobic physical activity, or an equivalent combination of moderate-  and vigorous-intensity aerobic activity. Aerobic activity should be performed in episodes of at least 10 minutes, and preferably, it should be spread throughout the week.  Behavioral modification strategies: meal planning and cooking strategies and keeping healthy foods in the home.  Judith [name] has agreed to follow-up with our clinic in 3 months. She was informed of the importance of frequent follow-up visits to maximize her success with intensive lifestyle modifications for her multiple health conditions.   Objective:   Blood pressure 107/72, pulse 63, temperature 98.2 F (36.8 C), temperature source Oral, height 5\' 4"  (1.626 m), weight 156 lb (70.8 kg), SpO2 99 %. Body mass index is 26.78 kg/m.  General: Cooperative, alert, well developed, in no acute distress. HEENT: Conjunctivae and lids unremarkable. Cardiovascular: Regular rhythm.  Lungs: Normal work of breathing. Neurologic: No focal deficits.   Lab Results  Component Value Date   CREATININE 0.75 05/21/2019   BUN 14 05/21/2019   NA 142 05/21/2019   K 5.3 (H) 05/21/2019   CL 106 05/21/2019   CO2 25 05/21/2019   Lab Results  Component Value Date   ALT 13 05/21/2019   AST 15 05/21/2019   ALKPHOS 93 05/21/2019   BILITOT 0.4 05/21/2019   Lab Results  Component Value Date   HGBA1C 4.7 (L) 05/21/2019   HGBA1C 5.1 11/01/2018   HGBA1C 5.2 05/06/2018   HGBA1C 5.2 10/12/2017   HGBA1C 5.2 01/18/2017   Lab Results  Component Value Date   INSULIN 33.0 (H) 05/21/2019   INSULIN 3.4 11/01/2018  INSULIN 6.4 05/06/2018   INSULIN 10.0 01/09/2018   Lab Results  Component Value Date   TSH 2.91 02/17/2019   Lab Results  Component Value Date   CHOL 148 02/17/2019   HDL 45.10 02/17/2019   LDLCALC 93 02/17/2019   TRIG 47.0 02/17/2019   CHOLHDL 3 02/17/2019   Lab Results  Component Value Date   WBC 5.8 02/17/2019   HGB 14.5 02/17/2019   HCT 42.9 02/17/2019   MCV 91.1 02/17/2019   PLT 294.0 02/17/2019   No results found  for: IRON, TIBC, FERRITIN  Attestation Statements:   Reviewed by clinician on day of visit: allergies, medications, problem list, medical history, surgical history, family history, social history, and previous encounter notes.  Time spent on visit including pre-visit chart review and post-visit charting and care was 30 minutes.   IMichaelene Song, am acting as transcriptionist for Abby Potash, PA-C   I have reviewed the above documentation for accuracy and completeness, and I agree with the above. Abby Potash, PA-C

## 2020-01-25 IMAGING — MG MM DIGITAL SCREENING BILAT W/ TOMO W/ CAD
8 series · 8 of 24 positions shown · non-contrast
Comparison: Previous exam(s).

CLINICAL DATA: Screening.

EXAM:
DIGITAL SCREENING BILATERAL MAMMOGRAM WITH TOMO AND CAD

[R CC synth-2D]
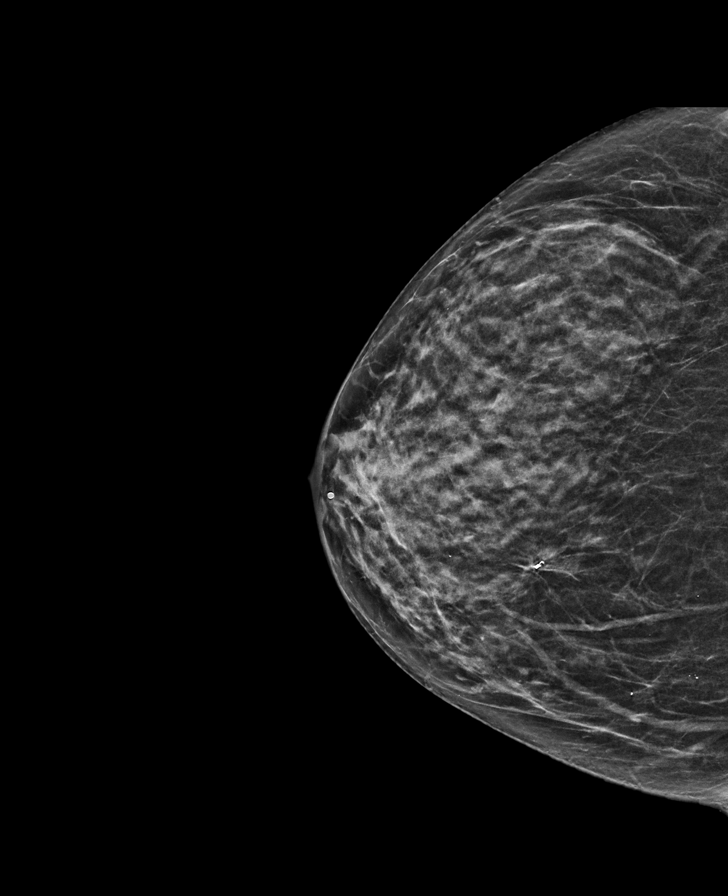

[L MLO synth-2D]
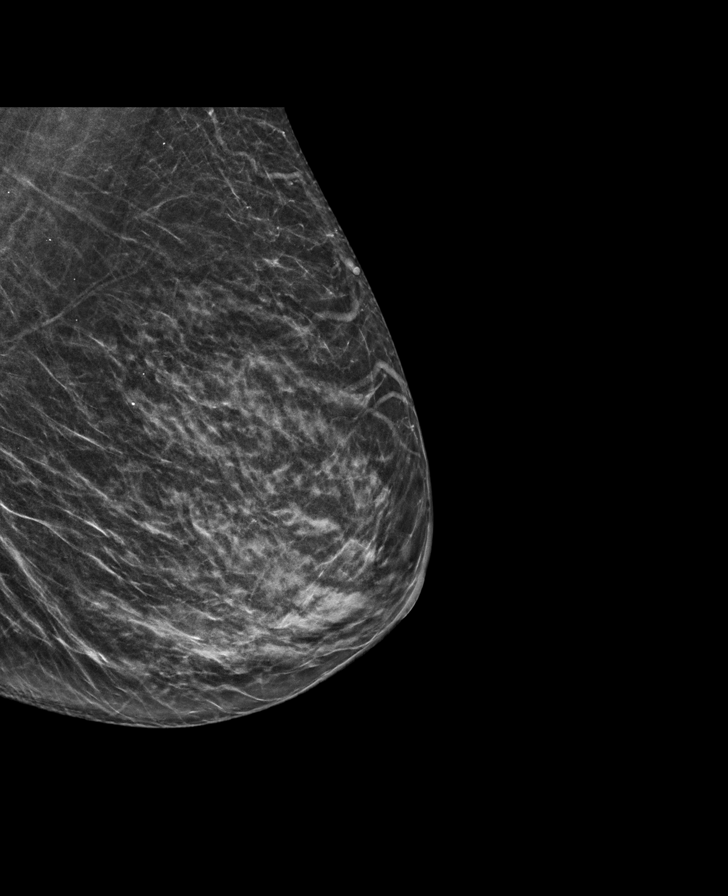

[L CC synth-2D]
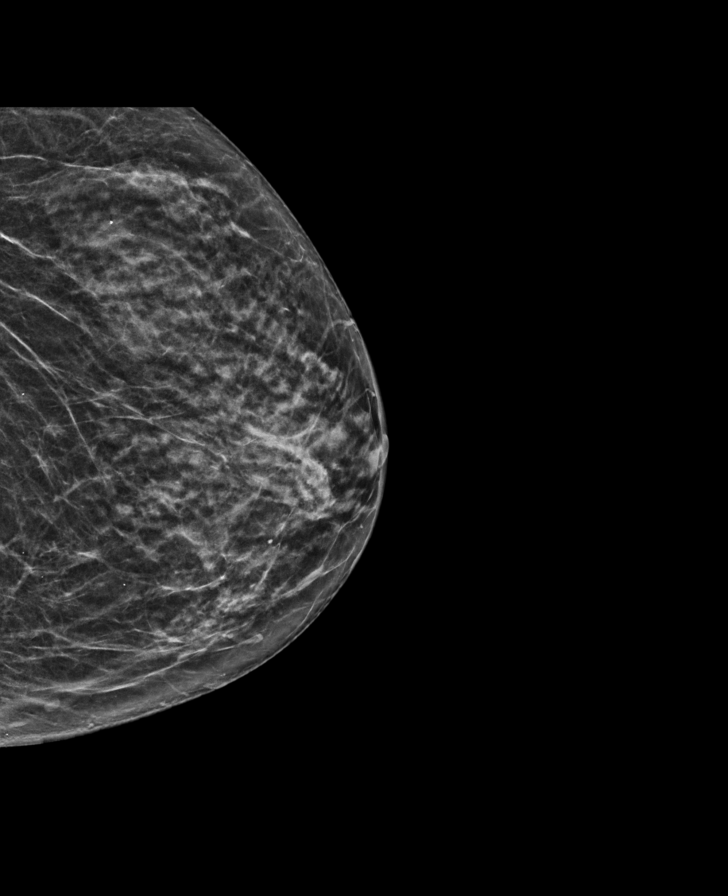

[R MLO synth-2D]
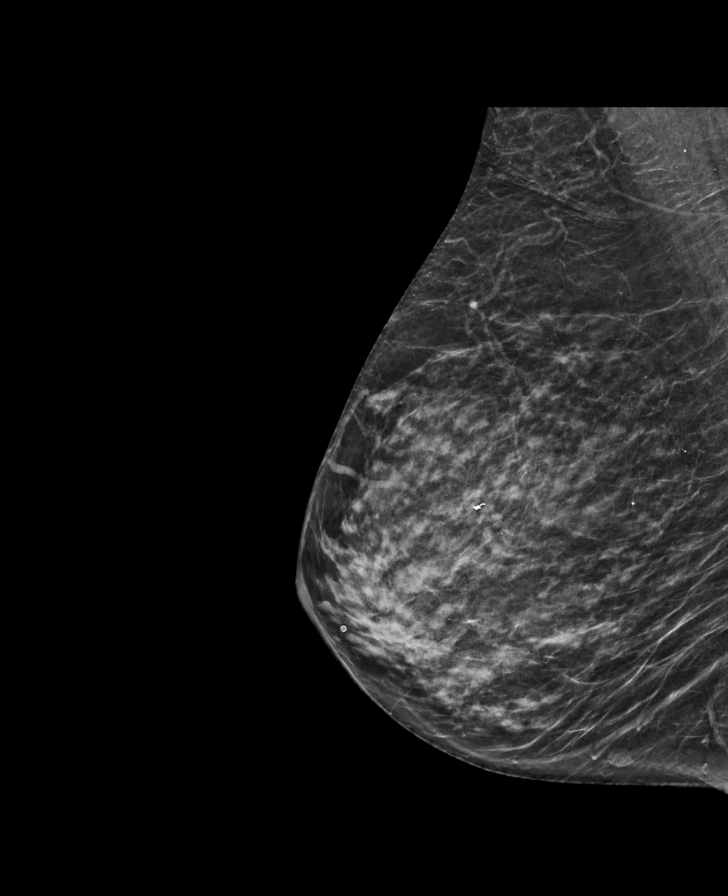

[R CC tomo · tomo slice 27/54.0]
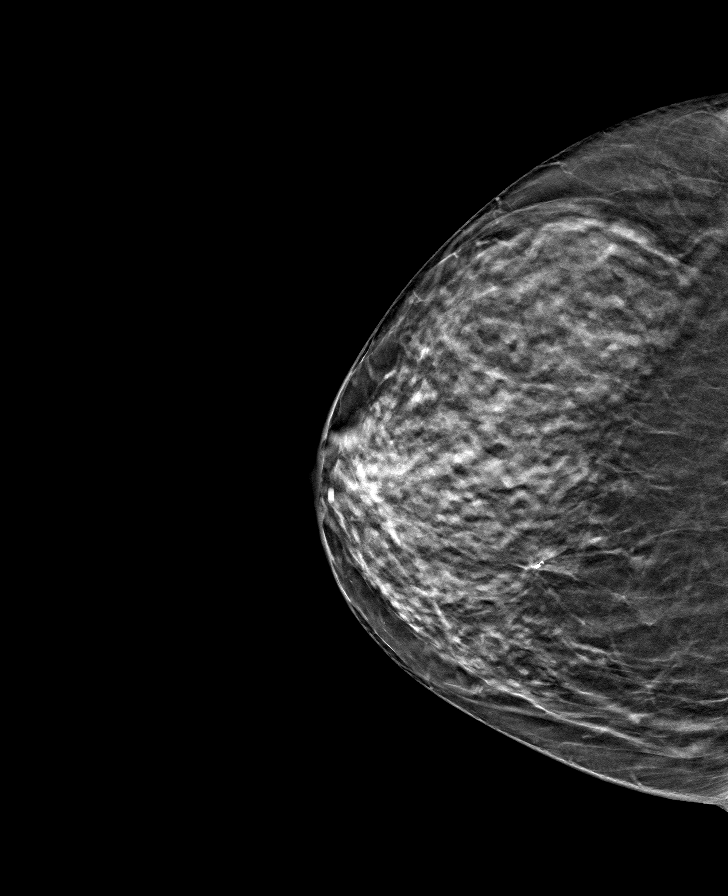

[L CC tomo · tomo slice 29/57.0]
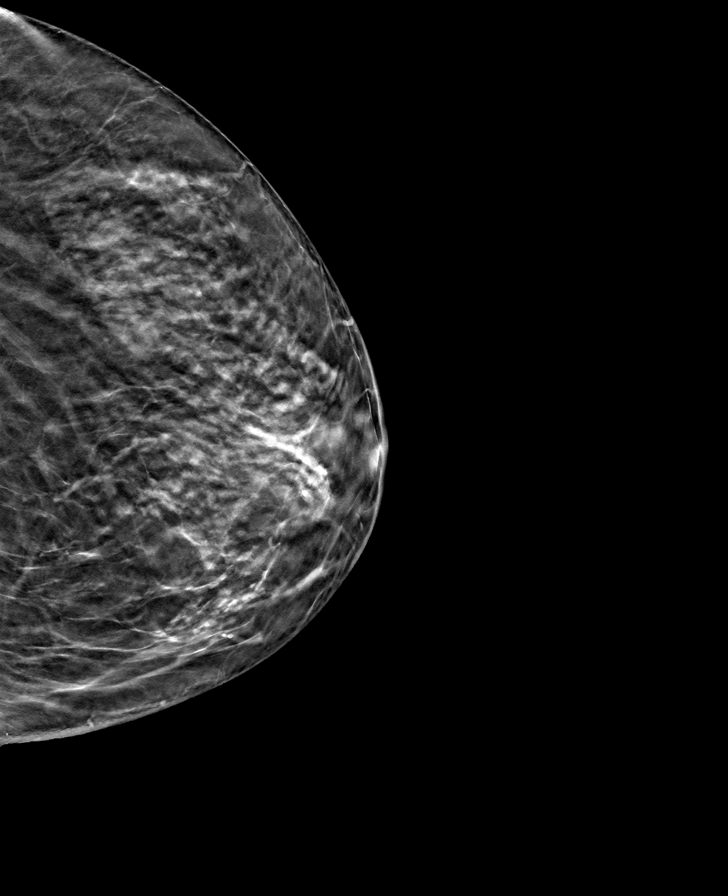

[L MLO tomo · tomo slice 29/58.0]
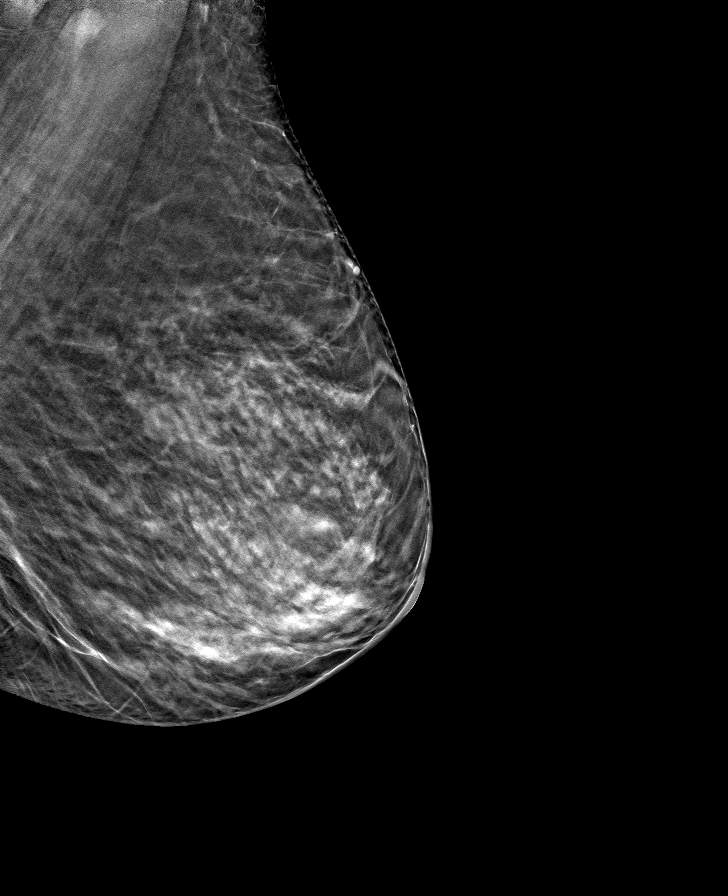

[R MLO tomo · tomo slice 29/57.0]
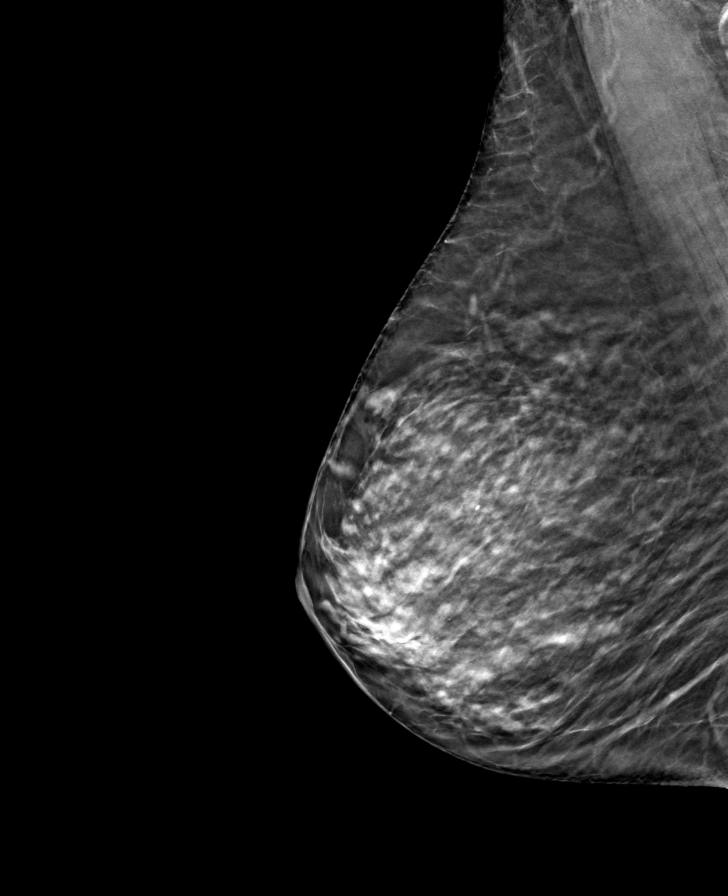

[8 of 24 positions shown; findings below may reference images not displayed]

ACR Breast Density Category c: The breast tissue is heterogeneously
dense, which may obscure small masses.
FINDINGS: There are no findings suspicious for malignancy. Images were
processed with CAD.
IMPRESSION: No mammographic evidence of malignancy. A result letter of this
screening mammogram will be mailed directly to the patient.

RECOMMENDATION:
Screening mammogram in one year. (Code:[id number])

BI-RADS CATEGORY  1: Negative.

## 2020-02-12 ENCOUNTER — Ambulatory Visit (INDEPENDENT_AMBULATORY_CARE_PROVIDER_SITE_OTHER): Payer: BC Managed Care – PPO | Admitting: Physician Assistant

## 2020-02-12 ENCOUNTER — Encounter (INDEPENDENT_AMBULATORY_CARE_PROVIDER_SITE_OTHER): Payer: Self-pay | Admitting: Physician Assistant

## 2020-02-12 ENCOUNTER — Other Ambulatory Visit: Payer: Self-pay

## 2020-02-12 VITALS — BP 104/72 | HR 63 | Temp 98.0°F | Ht 64.0 in | Wt 154.0 lb

## 2020-02-12 DIAGNOSIS — E7849 Other hyperlipidemia: Secondary | ICD-10-CM

## 2020-02-12 DIAGNOSIS — E559 Vitamin D deficiency, unspecified: Secondary | ICD-10-CM | POA: Diagnosis not present

## 2020-02-12 DIAGNOSIS — E669 Obesity, unspecified: Secondary | ICD-10-CM | POA: Diagnosis not present

## 2020-02-12 DIAGNOSIS — Z6830 Body mass index (BMI) 30.0-30.9, adult: Secondary | ICD-10-CM | POA: Diagnosis not present

## 2020-02-12 DIAGNOSIS — E8881 Metabolic syndrome: Secondary | ICD-10-CM | POA: Diagnosis not present

## 2020-02-12 DIAGNOSIS — Z9189 Other specified personal risk factors, not elsewhere classified: Secondary | ICD-10-CM | POA: Diagnosis not present

## 2020-02-12 NOTE — Progress Notes (Signed)
Chief Complaint:   OBESITY Judith [name] is here to discuss her progress with her obesity treatment plan along with follow-up of her obesity related diagnoses. Judith [name] is on the Category 2 Plan and states she is following her eating plan approximately 55-60% of the time. Judith [name] states she is exercising 0 minutes 0 times per week.  Today's visit was #: 23 Starting weight: 253 lbs Starting date: 01/09/2018 Today's weight: 154 lbs Today's date: 02/12/2020 Total lbs lost to date: 99 Total lbs lost since last in-office visit: 2  Interim History: Judith [name] reports that her weight fluctuates between 156 and 158 lbs. She is comfortable and feels good within this weight range. She denies new issues or complaints. She is weighing herself daily to keep herself accountable. She indulges here and there, but for the most part is still eating on plan.  Subjective:   Insulin resistance. Judith [name] has a diagnosis of insulin resistance based on her elevated fasting insulin level >5. She continues to work on diet and exercise to decrease her risk of diabetes. Judith [name] is on no medication and denies polyphagia.  Lab Results  Component Value Date   INSULIN 33.0 (H) 05/21/2019   INSULIN 3.4 11/01/2018   INSULIN 6.4 05/06/2018   INSULIN 10.0 01/09/2018   Lab Results  Component Value Date   HGBA1C 4.7 (L) 05/21/2019   Vitamin D deficiency. Judith [name] is on OTC Vitamin D 5,000 units daily. Energy is reported to be good.   Ref. Range 05/21/2019 14:41  Vitamin D, 25-Hydroxy Latest Ref Range: 30.0 - 100.0 ng/mL 52.2   Other hyperlipidemia. Judith [name] is taking Fish Oil. She reports following the plan 50% of the time. She has no desire to exercise. Is due for labs.  Lab Results  Component Value Date   CHOL 148 02/17/2019   HDL 45.10 02/17/2019   LDLCALC 93 02/17/2019   TRIG 47.0 02/17/2019   CHOLHDL 3 02/17/2019   Lab Results  Component Value Date   ALT 13 05/21/2019   AST 15 05/21/2019    ALKPHOS 93 05/21/2019   BILITOT 0.4 05/21/2019   The 10-year ASCVD risk score Judith [name] DC Jr., et al., 2013) is: 2.2%   Values used to calculate the score:     Age: 15 years     Sex: Female     Is Non-Hispanic African American: No     Diabetic: No     Tobacco smoker: No     Systolic Blood Pressure: 588 mmHg     Is BP treated: No     HDL Cholesterol: 45.1 mg/dL     Total Cholesterol: 148 mg/dL  At risk for heart disease. Judith [name] is at a higher than average risk for cardiovascular disease due to obesity.   Assessment/Plan:   Insulin resistance. Judith [name] will continue to work on weight loss, exercise, and decreasing simple carbohydrates to help decrease the risk of diabetes. Judith [name] agreed to follow-up with [place] as directed to closely monitor her progress. Labs will be checked today.  Vitamin D deficiency. Low Vitamin D level contributes to fatigue and are associated with obesity, breast, and colon cancer. She agrees to continue to take OTC Vitamin D as directed and VITAMIN D 25 Hydroxy (Vit-D Deficiency, Fractures) level will be checked today.   Other hyperlipidemia. Cardiovascular risk and specific lipid/LDL goals reviewed.  We discussed several lifestyle modifications today and Judith [name] will continue to work on diet, exercise and weight loss efforts. Orders and follow up as documented in patient record.  Labs will be checked today.  Counseling Intensive lifestyle modifications are the first line treatment for this issue. . Dietary changes: Increase soluble fiber. Decrease simple carbohydrates. . Exercise changes: Moderate to vigorous-intensity aerobic activity 150 minutes per week if tolerated.  . Lipid-lowering medications: see documented in medical record.  At risk for heart disease. Judith [name] was given approximately 15 minutes of coronary artery disease prevention counseling today. She is 64 y.o. female and has risk factors for heart disease including obesity. We discussed intensive lifestyle  modifications today with an emphasis on specific weight loss instructions and strategies.   Repetitive spaced learning was employed today to elicit superior memory formation and behavioral change.  Class 1 obesity with serious comorbidity and body mass index (BMI) of 30.0 to 30.9 in adult, unspecified obesity type.  Judith [name] is currently in the action stage of change. As such, her goal is to continue with weight loss efforts. She has agreed to the Category 2 Plan + 100-200 calories.   Exercise goals: For substantial health benefits, adults should do at least 150 minutes (2 hours and 30 minutes) a week of moderate-intensity, or 75 minutes (1 hour and 15 minutes) a week of vigorous-intensity aerobic physical activity, or an equivalent combination of moderate- and vigorous-intensity aerobic activity. Aerobic activity should be performed in episodes of at least 10 minutes, and preferably, it should be spread throughout the week.  Behavioral modification strategies: holiday eating strategies  and planning for success.  Judith [name] has agreed to follow-up with our clinic in 4 months. She was informed of the importance of frequent follow-up visits to maximize her success with intensive lifestyle modifications for her multiple health conditions.   Judith [name] was informed we would discuss her lab results at her next visit unless there is a critical issue that needs to be addressed sooner. Judith [name] agreed to keep her next visit at the agreed upon time to discuss these results.  Objective:   Blood pressure 104/72, pulse 63, temperature 98 F (36.7 C), height 5\' 4"  (1.626 m), weight 154 lb (69.9 kg), SpO2 98 %. Body mass index is 26.43 kg/m.  General: Cooperative, alert, well developed, in no acute distress. HEENT: Conjunctivae and lids unremarkable. Cardiovascular: Regular rhythm.  Lungs: Normal work of breathing. Neurologic: No focal deficits.   Lab Results  Component Value Date   CREATININE 0.75  05/21/2019   BUN 14 05/21/2019   NA 142 05/21/2019   K 5.3 (H) 05/21/2019   CL 106 05/21/2019   CO2 25 05/21/2019   Lab Results  Component Value Date   ALT 13 05/21/2019   AST 15 05/21/2019   ALKPHOS 93 05/21/2019   BILITOT 0.4 05/21/2019   Lab Results  Component Value Date   HGBA1C 4.7 (L) 05/21/2019   HGBA1C 5.1 11/01/2018   HGBA1C 5.2 05/06/2018   HGBA1C 5.2 10/12/2017   HGBA1C 5.2 01/18/2017   Lab Results  Component Value Date   INSULIN 33.0 (H) 05/21/2019   INSULIN 3.4 11/01/2018   INSULIN 6.4 05/06/2018   INSULIN 10.0 01/09/2018   Lab Results  Component Value Date   TSH 2.91 02/17/2019   Lab Results  Component Value Date   CHOL 148 02/17/2019   HDL 45.10 02/17/2019   LDLCALC 93 02/17/2019   TRIG 47.0 02/17/2019   CHOLHDL 3 02/17/2019   Lab Results  Component Value Date   WBC 5.8 02/17/2019   HGB 14.5 02/17/2019   HCT 42.9 02/17/2019   MCV 91.1 02/17/2019   PLT 294.0 02/17/2019  No results found for: IRON, TIBC, FERRITIN  Attestation Statements:   Reviewed by clinician on day of visit: allergies, medications, problem list, medical history, surgical history, family history, social history, and previous encounter notes.  IMichaelene Song, am acting as transcriptionist for Abby Potash, PA-C   I have reviewed the above documentation for accuracy and completeness, and I agree with the above. Abby Potash, PA-C

## 2020-02-13 LAB — COMPREHENSIVE METABOLIC PANEL
ALT: 15 IU/L (ref 0–32)
AST: 16 IU/L (ref 0–40)
Albumin/Globulin Ratio: 2.2 (ref 1.2–2.2)
Albumin: 4.4 g/dL (ref 3.8–4.8)
Alkaline Phosphatase: 94 IU/L (ref 44–121)
BUN/Creatinine Ratio: 20 (ref 12–28)
BUN: 15 mg/dL (ref 8–27)
Bilirubin Total: 0.5 mg/dL (ref 0.0–1.2)
CO2: 25 mmol/L (ref 20–29)
Calcium: 9.6 mg/dL (ref 8.7–10.3)
Chloride: 106 mmol/L (ref 96–106)
Creatinine, Ser: 0.75 mg/dL (ref 0.57–1.00)
GFR calc Af Amer: 99 mL/min/{1.73_m2} (ref >59)
GFR calc non Af Amer: 86 mL/min/{1.73_m2} (ref >59)
Globulin, Total: 2 g/dL (ref 1.5–4.5)
Glucose: 92 mg/dL (ref 65–99)
Potassium: 4.6 mmol/L (ref 3.5–5.2)
Sodium: 141 mmol/L (ref 134–144)
Total Protein: 6.4 g/dL (ref 6.0–8.5)

## 2020-02-13 LAB — VITAMIN D 25 HYDROXY (VIT D DEFICIENCY, FRACTURES): Vit D, 25-Hydroxy: 70.1 ng/mL (ref 30.0–100.0)

## 2020-02-13 LAB — LIPID PANEL
Chol/HDL Ratio: 2.6 ratio (ref 0.0–4.4)
Cholesterol, Total: 171 mg/dL (ref 100–199)
HDL: 65 mg/dL (ref >39)
LDL Chol Calc (NIH): 96 mg/dL (ref 0–99)
Triglycerides: 51 mg/dL (ref 0–149)
VLDL Cholesterol Cal: 10 mg/dL (ref 5–40)

## 2020-02-13 LAB — INSULIN, RANDOM: INSULIN: 3.8 u[IU]/mL (ref 2.6–24.9)

## 2020-02-13 LAB — HEMOGLOBIN A1C
Est. average glucose Bld gHb Est-mCnc: 103 mg/dL
Hgb A1c MFr Bld: 5.2 % (ref 4.8–5.6)

## 2020-02-16 ENCOUNTER — Telehealth (INDEPENDENT_AMBULATORY_CARE_PROVIDER_SITE_OTHER): Payer: Self-pay

## 2020-02-16 NOTE — Telephone Encounter (Signed)
Contacted pt and notified of Vitamin D results. Pt was instructed to decrease OTC Vit. D to 2000u daily. Pt verbalized understanding. Nothing further needed at this time.

## 2020-02-20 ENCOUNTER — Encounter: Payer: Self-pay | Admitting: Family Medicine

## 2020-02-20 ENCOUNTER — Other Ambulatory Visit: Payer: Self-pay

## 2020-02-20 ENCOUNTER — Ambulatory Visit (INDEPENDENT_AMBULATORY_CARE_PROVIDER_SITE_OTHER): Payer: BC Managed Care – PPO | Admitting: Family Medicine

## 2020-02-20 ENCOUNTER — Other Ambulatory Visit (HOSPITAL_BASED_OUTPATIENT_CLINIC_OR_DEPARTMENT_OTHER): Payer: Self-pay | Admitting: Family Medicine

## 2020-02-20 ENCOUNTER — Other Ambulatory Visit: Payer: Self-pay | Admitting: Family Medicine

## 2020-02-20 VITALS — BP 106/70 | HR 73 | Temp 98.2°F | Resp 18 | Ht 64.0 in | Wt 162.0 lb

## 2020-02-20 DIAGNOSIS — Z23 Encounter for immunization: Secondary | ICD-10-CM

## 2020-02-20 DIAGNOSIS — E559 Vitamin D deficiency, unspecified: Secondary | ICD-10-CM | POA: Diagnosis not present

## 2020-02-20 DIAGNOSIS — Z1231 Encounter for screening mammogram for malignant neoplasm of breast: Secondary | ICD-10-CM

## 2020-02-20 DIAGNOSIS — E7849 Other hyperlipidemia: Secondary | ICD-10-CM | POA: Diagnosis not present

## 2020-02-20 DIAGNOSIS — Z1211 Encounter for screening for malignant neoplasm of colon: Secondary | ICD-10-CM

## 2020-02-20 DIAGNOSIS — E2839 Other primary ovarian failure: Secondary | ICD-10-CM | POA: Diagnosis not present

## 2020-02-20 DIAGNOSIS — Z0000 Encounter for general adult medical examination without abnormal findings: Secondary | ICD-10-CM

## 2020-02-20 NOTE — Progress Notes (Signed)
Subjective:     Judith [name] is a 64 y.o. female and is here for a comprehensive physical exam. The patient reports no problems.  Social History   Socioeconomic History  . Marital status: Married    Spouse name: Juanda Crumble  . Number of children: 0  . Years of education: Not on file  . Highest education level: Not on file  Occupational History  . 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 Smoking status: Former Smoker    Packs/day: 0.20    Years: 5.00    Pack years: 1.00    Types: Cigarettes    Quit date: 09/09/1986    Years since quitting: 33.4  . Smokeless tobacco: Former Systems developer    Quit date: 02/16/1985  Vaping Use  . Vaping Use: Never used  Substance and Sexual Activity  . Alcohol use: Yes    Alcohol/week: 14.0 standard drinks    Types: 14 Glasses of wine per week  . Drug use: No  . Sexual activity: Yes    Partners: Male  Other Topics Concern  . Not on file  Social History Narrative   Exercise--walk with dog   Social Determinants of Health   Financial Resource Strain:   . Difficulty of Paying Living Expenses: Not on file  Food Insecurity:   . Worried About Charity fundraiser in the Last Year: Not on file  . Ran Out of Food in the Last Year: Not on file  Transportation Needs:   . Lack of Transportation (Medical): Not on file  . Lack of Transportation (Non-Medical): Not on file  Physical Activity:   . Days of Exercise per Week: Not on file  . Minutes of Exercise per Session: Not on file  Stress:   . Feeling of Stress : Not on file  Social Connections:   . Frequency of Communication with Friends and Family: Not on file  . Frequency of Social Gatherings with Friends and Family: Not on file  . Attends Religious Services: Not on file  . Active Member of Clubs or Organizations: Not on file  . Attends Archivist Meetings: Not on file  . Marital Status: Not on file  Intimate Partner Violence:   . Fear of  Current or Ex-Partner: Not on file  . Emotionally Abused: Not on file  . Physically Abused: Not on file  . Sexually Abused: Not on file   Health Maintenance  Topic Date Due  . MAMMOGRAM  02/10/2020  . COLONOSCOPY  02/17/2020  . HIV Screening  10/11/2027 (Originally 02/12/1971)  . TETANUS/TDAP  09/08/2021  . PAP SMEAR-Modifier  02/16/2022  . INFLUENZA VACCINE  Completed  . COVID-19 Vaccine  Completed  . Hepatitis C Screening  Completed    The following portions of the patient's history were reviewed and updated as appropriate:  She  has a past medical history of Obesity. She does not have any pertinent problems on file. She  has no past surgical history on file. Her family history includes Arthritis in her father; Cancer in her father; Cancer (age of onset: 100) in her mother. She  reports that she quit smoking about 33 years ago. Her smoking use included cigarettes. She has a 1.00 pack-year smoking history. She quit smokeless tobacco use about 35 years ago. She reports current alcohol use of about 14.0 standard drinks of alcohol per week. She reports that she does not use drugs. She has a current medication list which  includes the following prescription(s): calcium-magnesium-vitamin d, vitamin d3, fish oil, and vitamin c. Current Outpatient Medications on File Prior to Visit  Medication Sig Dispense Refill  . Calcium-Magnesium-Vitamin D (CALCIUM 1200+D3 PO) Take 1 tablet by mouth daily. 1000U vit d    . Cholecalciferol (VITAMIN D3) 125 MCG (5000 UT) CAPS Take 5,000 Units by mouth daily.    . Omega-3 Fatty Acids (FISH OIL) 1200 MG CAPS Take 1 capsule by mouth daily.    . vitamin C (ASCORBIC ACID) 500 MG tablet Take 500 mg by mouth daily.     No current facility-administered medications on file prior to visit.   She has No Known Allergies..  Review of Systems Review of Systems  Constitutional: Negative for activity change, appetite change and fatigue.  HENT: Negative for hearing  loss, congestion, tinnitus and ear discharge.  dentist q69m Eyes: Negative for visual disturbance (see optho q1y -- vision corrected to 20/20 with glasses).  Respiratory: Negative for cough, chest tightness and shortness of breath.   Cardiovascular: Negative for chest pain, palpitations and leg swelling.  Gastrointestinal: Negative for abdominal pain, diarrhea, constipation and abdominal distention.  Genitourinary: Negative for urgency, frequency, decreased urine volume and difficulty urinating.  Musculoskeletal: Negative for back pain, arthralgias and gait problem.  Skin: Negative for color change, pallor and rash.  Neurological: Negative for dizziness, light-headedness, numbness and headaches.  Hematological: Negative for adenopathy. Does not bruise/bleed easily.  Psychiatric/Behavioral: Negative for suicidal ideas, confusion, sleep disturbance, self-injury, dysphoric mood, decreased concentration and agitation.       Objective:    BP 106/70 (BP Location: Right Arm, Patient Position: Sitting, Cuff Size: Normal)   Pulse 73   Temp 98.2 F (36.8 C) (Oral)   Resp 18   Ht 5\' 4"  (1.626 m)   Wt 162 lb (73.5 kg)   SpO2 97%   BMI 27.81 kg/m  General appearance: alert, cooperative, appears stated age and no distress Head: Normocephalic, without obvious abnormality, atraumatic Eyes: negative findings: lids and lashes normal, conjunctivae and sclerae normal and pupils equal, round, reactive to light and accomodation Ears: normal TM's and external ear canals both ears Neck: no adenopathy, no carotid bruit, no JVD, supple, symmetrical, trachea midline and thyroid not enlarged, symmetric, no tenderness/mass/nodules Back: symmetric, no curvature. ROM normal. No CVA tenderness. Lungs: clear to auscultation bilaterally Breasts: normal appearance, no masses or tenderness Heart: regular rate and rhythm, S1, S2 normal, no murmur, click, rub or gallop Abdomen: soft, non-tender; bowel sounds normal;  no masses,  no organomegaly Pelvic: deferred Extremities: extremities normal, atraumatic, no cyanosis or edema Pulses: 2+ and symmetric Skin: Skin color, texture, turgor normal. No rashes or lesions Lymph nodes: Cervical, supraclavicular, and axillary nodes normal. Neurologic: Alert and oriented X 3, normal strength and tone. Normal symmetric reflexes. Normal coordination and gait    Assessment:    Healthy female exam.      Plan:     ghm utd Check labs  See After Visit Summary for Counseling Recommendations    1. Need for influenza vaccination  - Flu Vaccine QUAD 6+ mos PF IM (Fluarix Quad PF)  2. Preventative health care See above  - CBC with Differential/Platelet - Comprehensive metabolic panel - Lipid panel - TSH  3. Vitamin D deficiency  - VITAMIN D 25 Hydroxy (Vit-D Deficiency, Fractures)  4. Need for shingles vaccine  - Varicella-zoster vaccine IM  5. Estrogen deficiency  - DG Bone Density; Future  6. Colon cancer screening  - Ambulatory referral to  Gastroenterology

## 2020-02-20 NOTE — Patient Instructions (Signed)

## 2020-02-21 LAB — CBC WITH DIFFERENTIAL/PLATELET
Absolute Monocytes: 383 cells/uL (ref 200–950)
Basophils Absolute: 40 cells/uL (ref 0–200)
Basophils Relative: 0.6 %
Eosinophils Absolute: 178 cells/uL (ref 15–500)
Eosinophils Relative: 2.7 %
HCT: 43 % (ref 35.0–45.0)
Hemoglobin: 14.2 g/dL (ref 11.7–15.5)
Lymphs Abs: 2297 cells/uL (ref 850–3900)
MCH: 30.6 pg (ref 27.0–33.0)
MCHC: 33 g/dL (ref 32.0–36.0)
MCV: 92.7 fL (ref 80.0–100.0)
MPV: 10.1 fL (ref 7.5–12.5)
Monocytes Relative: 5.8 %
Neutro Abs: 3703 cells/uL (ref 1500–7800)
Neutrophils Relative %: 56.1 %
Platelets: 268 10*3/uL (ref 140–400)
RBC: 4.64 10*6/uL (ref 3.80–5.10)
RDW: 12 % (ref 11.0–15.0)
Total Lymphocyte: 34.8 %
WBC: 6.6 10*3/uL (ref 3.8–10.8)

## 2020-02-21 LAB — COMPREHENSIVE METABOLIC PANEL
AG Ratio: 2.1 (calc) (ref 1.0–2.5)
ALT: 11 U/L (ref 6–29)
AST: 13 U/L (ref 10–35)
Albumin: 4.1 g/dL (ref 3.6–5.1)
Alkaline phosphatase (APISO): 81 U/L (ref 37–153)
BUN: 22 mg/dL (ref 7–25)
CO2: 26 mmol/L (ref 20–32)
Calcium: 9.8 mg/dL (ref 8.6–10.4)
Chloride: 108 mmol/L (ref 98–110)
Creat: 0.68 mg/dL (ref 0.50–0.99)
Globulin: 2 g/dL (calc) (ref 1.9–3.7)
Glucose, Bld: 94 mg/dL (ref 65–99)
Potassium: 4.9 mmol/L (ref 3.5–5.3)
Sodium: 142 mmol/L (ref 135–146)
Total Bilirubin: 0.6 mg/dL (ref 0.2–1.2)
Total Protein: 6.1 g/dL (ref 6.1–8.1)

## 2020-02-21 LAB — LIPID PANEL
Cholesterol: 153 mg/dL (ref <200)
HDL: 69 mg/dL (ref >=50)
LDL Cholesterol (Calc): 72 mg/dL (calc)
Non-HDL Cholesterol (Calc): 84 mg/dL (calc) (ref <130)
Total CHOL/HDL Ratio: 2.2 (calc) (ref <5.0)
Triglycerides: 42 mg/dL (ref <150)

## 2020-02-21 LAB — TSH: TSH: 2.35 mIU/L (ref 0.40–4.50)

## 2020-02-21 LAB — VITAMIN D 25 HYDROXY (VIT D DEFICIENCY, FRACTURES): Vit D, 25-Hydroxy: 60 ng/mL (ref 30–100)

## 2020-03-08 DIAGNOSIS — H9313 Tinnitus, bilateral: Secondary | ICD-10-CM | POA: Diagnosis not present

## 2020-03-08 DIAGNOSIS — H903 Sensorineural hearing loss, bilateral: Secondary | ICD-10-CM | POA: Diagnosis not present

## 2020-04-08 ENCOUNTER — Other Ambulatory Visit: Payer: Self-pay | Admitting: Family Medicine

## 2020-04-08 DIAGNOSIS — Z1231 Encounter for screening mammogram for malignant neoplasm of breast: Secondary | ICD-10-CM

## 2020-04-12 ENCOUNTER — Other Ambulatory Visit (HOSPITAL_BASED_OUTPATIENT_CLINIC_OR_DEPARTMENT_OTHER): Payer: BC Managed Care – PPO

## 2020-04-12 ENCOUNTER — Ambulatory Visit (HOSPITAL_BASED_OUTPATIENT_CLINIC_OR_DEPARTMENT_OTHER): Payer: BC Managed Care – PPO

## 2020-04-21 ENCOUNTER — Ambulatory Visit (INDEPENDENT_AMBULATORY_CARE_PROVIDER_SITE_OTHER): Payer: BC Managed Care – PPO

## 2020-04-21 ENCOUNTER — Other Ambulatory Visit: Payer: Self-pay

## 2020-04-21 DIAGNOSIS — Z23 Encounter for immunization: Secondary | ICD-10-CM | POA: Diagnosis not present

## 2020-04-21 NOTE — Progress Notes (Signed)
Patient came in today for a zoster immunization per Dr.Lowne.  Patient tolerated the injection well, given IM in the right deltoid

## 2020-06-14 ENCOUNTER — Ambulatory Visit (INDEPENDENT_AMBULATORY_CARE_PROVIDER_SITE_OTHER): Payer: BC Managed Care – PPO | Admitting: Physician Assistant

## 2020-06-18 ENCOUNTER — Encounter: Payer: Self-pay | Admitting: Gastroenterology

## 2020-06-23 DIAGNOSIS — Z20822 Contact with and (suspected) exposure to covid-19: Secondary | ICD-10-CM | POA: Diagnosis not present

## 2020-07-19 ENCOUNTER — Other Ambulatory Visit: Payer: Self-pay

## 2020-07-19 ENCOUNTER — Ambulatory Visit (INDEPENDENT_AMBULATORY_CARE_PROVIDER_SITE_OTHER): Payer: BC Managed Care – PPO | Admitting: Physician Assistant

## 2020-07-19 ENCOUNTER — Encounter (INDEPENDENT_AMBULATORY_CARE_PROVIDER_SITE_OTHER): Payer: Self-pay | Admitting: Physician Assistant

## 2020-07-19 VITALS — BP 119/77 | HR 68 | Temp 97.6°F | Ht 64.0 in | Wt 161.0 lb

## 2020-07-19 DIAGNOSIS — E669 Obesity, unspecified: Secondary | ICD-10-CM | POA: Diagnosis not present

## 2020-07-19 DIAGNOSIS — Z6830 Body mass index (BMI) 30.0-30.9, adult: Secondary | ICD-10-CM

## 2020-07-19 DIAGNOSIS — E8881 Metabolic syndrome: Secondary | ICD-10-CM | POA: Diagnosis not present

## 2020-07-21 NOTE — Progress Notes (Signed)
Chief Complaint:   OBESITY Judith [name] is here to discuss her progress with her obesity treatment plan along with follow-up of her obesity related diagnoses. Judith [name] is on the Category 2 Plan + 100-200 calories and states she is following her eating plan approximately 50% of the time. Judith [name] states she is doing 0 minutes 0 times per week.  Today's visit was #: 24 Starting weight: 253 lbs Starting date: 01/09/2018 Today's weight: 161 lbs Today's date: 07/19/2020 Total lbs lost to date: 92 Total lbs lost since last in-office visit: 0  Interim History: Judith [name] reports that she fell back into some bad habits including skipping dinner. She has been in a maintenance phase. She is having no issues with the plan itself.  Subjective:   1. Insulin resistance Judith [name]'s last insulin level was normal, and she will be due for labs at her next visit.  Assessment/Plan:   1. Insulin resistance Judith [name] will continue to work on weight loss, exercise, and decreasing simple carbohydrates to help decrease the risk of diabetes. We will recheck labs at her next visit. Judith [name] agreed to follow-up with [place] as directed to closely monitor her progress.  2. Class 1 obesity with serious comorbidity and body mass index (BMI) of 30.0 to 30.9 in adult, unspecified obesity type Judith [name] is currently in the action stage of change. As such, her goal is to continue with weight loss efforts. She has agreed to the Category 2 Plan.   We will recheck fasting labs at her next visit.  Exercise goals: No exercise has been prescribed at this time.  Behavioral modification strategies: no skipping meals and meal planning and cooking strategies.  Judith [name] has agreed to follow-up with our clinic in 4 weeks. She was informed of the importance of frequent follow-up visits to maximize her success with intensive lifestyle modifications for her multiple health conditions.   Objective:   Blood pressure 119/77, pulse 68, temperature  97.6 F (36.4 C), height 5\' 4"  (1.626 m), weight 161 lb (73 kg), SpO2 97 %. Body mass index is 27.64 kg/m.  General: Cooperative, alert, well developed, in no acute distress. HEENT: Conjunctivae and lids unremarkable. Cardiovascular: Regular rhythm.  Lungs: Normal work of breathing. Neurologic: No focal deficits.   Lab Results  Component Value Date   CREATININE 0.68 02/20/2020   BUN 22 02/20/2020   NA 142 02/20/2020   K 4.9 02/20/2020   CL 108 02/20/2020   CO2 26 02/20/2020   Lab Results  Component Value Date   ALT 11 02/20/2020   AST 13 02/20/2020   ALKPHOS 94 02/12/2020   BILITOT 0.6 02/20/2020   Lab Results  Component Value Date   HGBA1C 5.2 02/12/2020   HGBA1C 4.7 (L) 05/21/2019   HGBA1C 5.1 11/01/2018   HGBA1C 5.2 05/06/2018   HGBA1C 5.2 10/12/2017   Lab Results  Component Value Date   INSULIN 3.8 02/12/2020   INSULIN 33.0 (H) 05/21/2019   INSULIN 3.4 11/01/2018   INSULIN 6.4 05/06/2018   INSULIN 10.0 01/09/2018   Lab Results  Component Value Date   TSH 2.35 02/20/2020   Lab Results  Component Value Date   CHOL 153 02/20/2020   HDL 69 02/20/2020   LDLCALC 72 02/20/2020   TRIG 42 02/20/2020   CHOLHDL 2.2 02/20/2020   Lab Results  Component Value Date   WBC 6.6 02/20/2020   HGB 14.2 02/20/2020   HCT 43.0 02/20/2020   MCV 92.7 02/20/2020   PLT 268 02/20/2020   No results  found for: IRON, TIBC, FERRITIN  Attestation Statements:   Reviewed by clinician on day of visit: allergies, medications, problem list, medical history, surgical history, family history, social history, and previous encounter notes.  Time spent on visit including pre-visit chart review and post-visit care and charting was 30 minutes.    Wilhemena Durie, am acting as transcriptionist for [employer], PA-C.  I have reviewed the above documentation for accuracy and completeness, and I agree with the above. Abby Potash, PA-C

## 2020-08-16 ENCOUNTER — Encounter: Payer: Self-pay | Admitting: Internal Medicine

## 2020-09-09 ENCOUNTER — Ambulatory Visit (HOSPITAL_BASED_OUTPATIENT_CLINIC_OR_DEPARTMENT_OTHER)
Admission: RE | Admit: 2020-09-09 | Discharge: 2020-09-09 | Disposition: A | Discharge status: Home / Self Care | Payer: BC Managed Care – PPO | Source: Ambulatory Visit | Attending: Family Medicine | Admitting: Family Medicine

## 2020-09-09 ENCOUNTER — Other Ambulatory Visit: Payer: Self-pay

## 2020-09-09 ENCOUNTER — Encounter (HOSPITAL_BASED_OUTPATIENT_CLINIC_OR_DEPARTMENT_OTHER): Payer: Self-pay

## 2020-09-09 DIAGNOSIS — M85851 Other specified disorders of bone density and structure, right thigh: Secondary | ICD-10-CM | POA: Diagnosis not present

## 2020-09-09 DIAGNOSIS — Z1231 Encounter for screening mammogram for malignant neoplasm of breast: Secondary | ICD-10-CM | POA: Diagnosis not present

## 2020-09-09 DIAGNOSIS — E2839 Other primary ovarian failure: Secondary | ICD-10-CM | POA: Insufficient documentation

## 2020-09-09 DIAGNOSIS — Z780 Asymptomatic menopausal state: Secondary | ICD-10-CM | POA: Diagnosis not present

## 2020-09-13 ENCOUNTER — Other Ambulatory Visit: Payer: Self-pay | Admitting: Family Medicine

## 2020-09-13 DIAGNOSIS — R928 Other abnormal and inconclusive findings on diagnostic imaging of breast: Secondary | ICD-10-CM

## 2020-10-11 ENCOUNTER — Other Ambulatory Visit: Payer: BC Managed Care – PPO

## 2020-10-22 ENCOUNTER — Ambulatory Visit (AMBULATORY_SURGERY_CENTER): Payer: BC Managed Care – PPO

## 2020-10-22 VITALS — Ht 64.0 in | Wt 160.0 lb

## 2020-10-22 DIAGNOSIS — Z1211 Encounter for screening for malignant neoplasm of colon: Secondary | ICD-10-CM

## 2020-10-22 NOTE — Progress Notes (Signed)

## 2020-10-29 ENCOUNTER — Encounter: Payer: BC Managed Care – PPO | Admitting: Gastroenterology

## 2020-10-29 ENCOUNTER — Ambulatory Visit: Payer: BC Managed Care – PPO

## 2020-10-29 ENCOUNTER — Ambulatory Visit
Admission: RE | Admit: 2020-10-29 | Discharge: 2020-10-29 | Disposition: A | Discharge status: Home / Self Care | Payer: BC Managed Care – PPO | Source: Ambulatory Visit | Attending: Family Medicine | Admitting: Family Medicine

## 2020-10-29 ENCOUNTER — Other Ambulatory Visit: Payer: Self-pay

## 2020-10-29 DIAGNOSIS — R928 Other abnormal and inconclusive findings on diagnostic imaging of breast: Secondary | ICD-10-CM | POA: Diagnosis not present

## 2020-10-29 DIAGNOSIS — R922 Inconclusive mammogram: Secondary | ICD-10-CM | POA: Diagnosis not present

## 2020-11-05 ENCOUNTER — Encounter: Payer: Self-pay | Admitting: Internal Medicine

## 2020-11-05 ENCOUNTER — Other Ambulatory Visit: Payer: Self-pay

## 2020-11-05 ENCOUNTER — Ambulatory Visit (AMBULATORY_SURGERY_CENTER): Payer: BC Managed Care – PPO | Admitting: Internal Medicine

## 2020-11-05 VITALS — BP 97/68 | HR 67 | Temp 97.8°F | Resp 12 | Ht 64.0 in | Wt 160.0 lb

## 2020-11-05 DIAGNOSIS — D124 Benign neoplasm of descending colon: Secondary | ICD-10-CM

## 2020-11-05 DIAGNOSIS — K635 Polyp of colon: Secondary | ICD-10-CM

## 2020-11-05 DIAGNOSIS — Z1211 Encounter for screening for malignant neoplasm of colon: Secondary | ICD-10-CM | POA: Diagnosis not present

## 2020-11-05 DIAGNOSIS — D123 Benign neoplasm of transverse colon: Secondary | ICD-10-CM

## 2020-11-05 MED ORDER — SODIUM CHLORIDE 0.9 % IV SOLN: 500.0000 mL | Freq: Once | INTRAVENOUS | Status: DC

## 2020-11-05 NOTE — Progress Notes (Signed)
Called to room to assist during endoscopic procedure.  Patient ID and intended procedure confirmed with present staff. Received instructions for my participation in the procedure from the performing physician.  

## 2020-11-05 NOTE — Op Note (Signed)
[facility] Patient Name: Judith [name] Procedure Date: 11/05/2020 1:48 PM MRN: [id number] Endoscopist: Gatha Mayer , MD Age: 65 Referring MD:  Date of Birth: 1955-06-20 Gender: Female Account #: [id number] Procedure:                Colonoscopy Indications:              Screening for colorectal malignant neoplasm, Last                            colonoscopy: 2011 Medicines:                Propofol per Anesthesia, Monitored Anesthesia Care Procedure:                Pre-Anesthesia Assessment:                           - Prior to the procedure, a History and Physical                            was performed, and patient medications and                            allergies were reviewed. The patient's tolerance of                            previous anesthesia was also reviewed. The risks                            and benefits of the procedure and the sedation                            options and risks were discussed with the patient.                            All questions were answered, and informed consent                            was obtained. Prior Anticoagulants: The patient has                            taken no previous anticoagulant or antiplatelet                            agents. ASA Grade Assessment: II - A patient with                            mild systemic disease. After reviewing the risks                            and benefits, the patient was deemed in                            satisfactory condition to undergo the procedure.  After obtaining informed consent, the colonoscope                            was passed under direct vision. Throughout the                            procedure, the patient's blood pressure, pulse, and                            oxygen saturations were monitored continuously. The                            Olympus PFC-[id number] (#[id number]) Colonoscope was                            introduced  through the anus and advanced to the the                            cecum, identified by appendiceal orifice and                            ileocecal valve. The colonoscopy was somewhat                            difficult due to significant looping. Successful                            completion of the procedure was aided by applying                            abdominal pressure. The patient tolerated the                            procedure well. The quality of the bowel                            preparation was adequate. The bowel preparation                            used was Miralax via split dose instruction. The                            ileocecal valve, appendiceal orifice, and rectum                            were photographed. Scope In: 1:58:41 PM Scope Out: 2:19:50 PM Scope Withdrawal Time: 0 hours 11 minutes 49 seconds  Total Procedure Duration: 0 hours 21 minutes 9 seconds  Findings:                 The perianal exam findings include a perianal rash.                           Two flat and sessile polyps were found in the  descending colon and transverse colon. The polyps                            were diminutive in size. These polyps were removed                            with a cold snare. Resection and retrieval were                            complete. Verification of patient identification                            for the specimen was done. Estimated blood loss was                            minimal.                           A few small-mouthed diverticula were found in the                            sigmoid colon.                           The exam was otherwise without abnormality on                            direct and retroflexion views. Complications:            No immediate complications. Estimated Blood Loss:     Estimated blood loss was minimal. Impression:               - Perianal rash found on perianal exam.                            - Two diminutive polyps in the descending colon and                            in the transverse colon, removed with a cold snare.                            Resected and retrieved.                           - Diverticulosis in the sigmoid colon.                           - The examination was otherwise normal on direct                            and retroflexion views. Recommendation:           - Patient has a contact number available for                            emergencies. The signs and symptoms of potential  delayed complications were discussed with the                            patient. Return to normal activities tomorrow.                            Written discharge instructions were provided to the                            patient.                           - Resume previous diet.                           - Continue present medications. Desitin or similar                            agent if needed for perianal rash                           - Repeat colonoscopy is recommended. The                            colonoscopy date will be determined after pathology                            results from today's exam become available for                            review. Gatha Mayer, MD 11/05/2020 2:26:40 PM This report has been signed electronically.

## 2020-11-05 NOTE — Patient Instructions (Addendum)
I found and removed 2 tiny polyps. I am sure they are benign - might be pre-cancerous.  I will let you know pathology results and when to have another routine colonoscopy by mail and/or My Chart.  I appreciate the opportunity to care for you. Gatha Mayer, MD, [facility]   Discharge instructions given. Handouts on polyps and Diverticulosis. Resume previous medications. YOU HAD AN ENDOSCOPIC PROCEDURE TODAY AT [facility]:   Refer to the procedure report that was given to you for any specific questions about what was found during the examination.  If the procedure report does not answer your questions, please call your gastroenterologist to clarify.  If you requested that your care partner not be given the details of your procedure findings, then the procedure report has been included in a sealed envelope for you to review at your convenience later.  YOU SHOULD EXPECT: Some feelings of bloating in the abdomen. Passage of more gas than usual.  Walking can help get rid of the air that was put into your GI tract during the procedure and reduce the bloating. If you had a lower endoscopy (such as a colonoscopy or flexible sigmoidoscopy) you may notice spotting of blood in your stool or on the toilet paper. If you underwent a bowel prep for your procedure, you may not have a normal bowel movement for a few days.  Please Note:  You might notice some irritation and congestion in your nose or some drainage.  This is from the oxygen used during your procedure.  There is no need for concern and it should clear up in a day or so.  SYMPTOMS TO REPORT IMMEDIATELY:  Following lower endoscopy (colonoscopy or flexible sigmoidoscopy):  Excessive amounts of blood in the stool  Significant tenderness or worsening of abdominal pains  Swelling of the abdomen that is new, acute  Fever of 100F or higher   For urgent or emergent issues, a gastroenterologist can be reached at any hour by calling [phone]  [phone]. Do not use MyChart messaging for urgent concerns.    DIET:  We do recommend a small meal at first, but then you may proceed to your regular diet.  Drink plenty of fluids but you should avoid alcoholic beverages for 24 hours.  ACTIVITY:  You should plan to take it easy for the rest of today and you should NOT DRIVE or use heavy machinery until tomorrow (because of the sedation medicines used during the test).    FOLLOW UP: Our staff will call the number listed on your records 48-72 hours following your procedure to check on you and address any questions or concerns that you may have regarding the information given to you following your procedure. If we do not reach you, we will leave a message.  We will attempt to reach you two times.  During this call, we will ask if you have developed any symptoms of COVID 19. If you develop any symptoms (ie: fever, flu-like symptoms, shortness of breath, cough etc.) before then, please call [phone].  If you test positive for Covid 19 in the 2 weeks post procedure, please call and report this information to [place].    If any biopsies were taken you will be contacted by phone or by letter within the next 1-3 weeks.  Please call us at [phone] if you have not heard about the biopsies in 3 weeks.    SIGNATURES/CONFIDENTIALITY: You and/or your care partner have signed paperwork which will be  entered into your electronic medical record.  These signatures attest to the fact that that the information above on your After Visit Summary has been reviewed and is understood.  Full responsibility of the confidentiality of this discharge information lies with you and/or your care-partner.

## 2020-11-05 NOTE — Progress Notes (Signed)
Medical history reviewed with no changes noted. VS assessed by C.W 

## 2020-11-05 NOTE — Progress Notes (Signed)
pt tolerated well. VSS. awake and to recovery. Report given to RN.  

## 2020-11-10 ENCOUNTER — Telehealth: Payer: Self-pay

## 2020-11-10 NOTE — Telephone Encounter (Signed)
  Follow up Call-  Call back number 11/05/2020  Post procedure Call Back phone  # [phone]  Permission to leave phone message Yes  Some recent data might be hidden     Left message

## 2020-11-10 NOTE — Telephone Encounter (Signed)
Left message on follow up call. 

## 2020-11-15 ENCOUNTER — Ambulatory Visit (INDEPENDENT_AMBULATORY_CARE_PROVIDER_SITE_OTHER): Payer: BC Managed Care – PPO | Admitting: Physician Assistant

## 2020-11-18 ENCOUNTER — Encounter: Payer: Self-pay | Admitting: Internal Medicine

## 2020-11-23 ENCOUNTER — Other Ambulatory Visit: Payer: Self-pay

## 2020-11-23 ENCOUNTER — Encounter (INDEPENDENT_AMBULATORY_CARE_PROVIDER_SITE_OTHER): Payer: Self-pay | Admitting: Physician Assistant

## 2020-11-23 ENCOUNTER — Ambulatory Visit (INDEPENDENT_AMBULATORY_CARE_PROVIDER_SITE_OTHER): Payer: BC Managed Care – PPO | Admitting: Physician Assistant

## 2020-11-23 VITALS — BP 122/70 | HR 80 | Temp 98.9°F | Ht 64.0 in | Wt 160.0 lb

## 2020-11-23 DIAGNOSIS — Z6830 Body mass index (BMI) 30.0-30.9, adult: Secondary | ICD-10-CM

## 2020-11-23 DIAGNOSIS — E7849 Other hyperlipidemia: Secondary | ICD-10-CM

## 2020-11-23 DIAGNOSIS — E8881 Metabolic syndrome: Secondary | ICD-10-CM | POA: Diagnosis not present

## 2020-11-23 DIAGNOSIS — E559 Vitamin D deficiency, unspecified: Secondary | ICD-10-CM

## 2020-11-23 DIAGNOSIS — Z9189 Other specified personal risk factors, not elsewhere classified: Secondary | ICD-10-CM

## 2020-11-23 DIAGNOSIS — E669 Obesity, unspecified: Secondary | ICD-10-CM | POA: Diagnosis not present

## 2020-11-24 LAB — COMPREHENSIVE METABOLIC PANEL
ALT: 20 IU/L (ref 0–32)
AST: 18 IU/L (ref 0–40)
Albumin/Globulin Ratio: 2.3 — ABNORMAL HIGH (ref 1.2–2.2)
Albumin: 4.5 g/dL (ref 3.8–4.8)
Alkaline Phosphatase: 94 IU/L (ref 44–121)
BUN/Creatinine Ratio: 13 (ref 12–28)
BUN: 11 mg/dL (ref 8–27)
Bilirubin Total: 0.4 mg/dL (ref 0.0–1.2)
CO2: 24 mmol/L (ref 20–29)
Calcium: 9.6 mg/dL (ref 8.7–10.3)
Chloride: 106 mmol/L (ref 96–106)
Creatinine, Ser: 0.86 mg/dL (ref 0.57–1.00)
Globulin, Total: 2 g/dL (ref 1.5–4.5)
Glucose: 80 mg/dL (ref 65–99)
Potassium: 5.5 mmol/L — ABNORMAL HIGH (ref 3.5–5.2)
Sodium: 143 mmol/L (ref 134–144)
Total Protein: 6.5 g/dL (ref 6.0–8.5)
eGFR: 76 mL/min/{1.73_m2} (ref >59)

## 2020-11-24 LAB — INSULIN, RANDOM: INSULIN: 2.5 u[IU]/mL — ABNORMAL LOW (ref 2.6–24.9)

## 2020-11-24 LAB — LIPID PANEL
Chol/HDL Ratio: 3 ratio (ref 0.0–4.4)
Cholesterol, Total: 201 mg/dL — ABNORMAL HIGH (ref 100–199)
HDL: 68 mg/dL (ref >39)
LDL Chol Calc (NIH): 117 mg/dL — ABNORMAL HIGH (ref 0–99)
Triglycerides: 87 mg/dL (ref 0–149)
VLDL Cholesterol Cal: 16 mg/dL (ref 5–40)

## 2020-11-24 LAB — HEMOGLOBIN A1C
Est. average glucose Bld gHb Est-mCnc: 97 mg/dL
Hgb A1c MFr Bld: 5 % (ref 4.8–5.6)

## 2020-11-24 LAB — VITAMIN D 25 HYDROXY (VIT D DEFICIENCY, FRACTURES): Vit D, 25-Hydroxy: 56.4 ng/mL (ref 30.0–100.0)

## 2020-11-30 NOTE — Progress Notes (Signed)
Chief Complaint:   OBESITY Judith [name] is here to discuss her progress with her obesity treatment plan along with follow-up of her obesity related diagnoses. [name] is on the Category 2 Plan and states Judith [name] is following her eating plan approximately 50% of the time. [name] states Judith [name] is doing 0 minutes 0 times per week.  Today's visit was #: 25 Starting weight: 253 lbs Starting date: 01/09/2018 Today's weight: 160 lbs Today's date: 11/23/2020 Total lbs lost to date: 93 Total lbs lost since last in-office visit: 1  Interim History: [name] is in maintenance and has not had an office visit since 07/2020. Judith [name] reports that Judith [name] has recently gotten into desserts. Overall, Judith [name] feels really good most days. Judith [name] wants to get down to 155 lbs.  Subjective:   1. Other hyperlipidemia Judith [name] is not on medications, and Judith [name] denies chest pain. Judith [name] is walking the dog 2 times per week.  2. Vitamin D deficiency Judith [name] is on OTC Vit D, and her energy level is good.  3. Insulin resistance Judith [name] denies polyphagia and Judith [name] is due for labs.  4. At risk for osteoporosis Judith [name] is at higher risk of osteopenia and osteoporosis due to Vitamin D deficiency.   Assessment/Plan:   1. Other hyperlipidemia Cardiovascular risk and specific lipid/LDL goals reviewed. We discussed several lifestyle modifications today. We will check labs today. Kateline will continue to work on diet, exercise and weight loss efforts. Orders and follow up as documented in patient record.   Counseling Intensive lifestyle modifications are the first line treatment for this issue. Dietary changes: Increase soluble fiber. Decrease simple carbohydrates. Exercise changes: Moderate to vigorous-intensity aerobic activity 150 minutes per week if tolerated. Lipid-lowering medications: see documented in medical record.  - Lipid panel  2. Vitamin D deficiency Low Vitamin D level contributes to fatigue and are associated with obesity,  breast, and colon cancer. We will check labs today. Shanan will follow-up for routine testing of Vitamin D, at least 2-3 times per year to avoid over-replacement.  - VITAMIN D 25 Hydroxy (Vit-D Deficiency, Fractures)  3. Insulin resistance Roshundra will continue to work on weight loss, exercise, and decreasing simple carbohydrates to help decrease the risk of diabetes. We will check labs today. Nekeidra agreed to follow-up with [place] as directed to closely monitor her progress.  - Hemoglobin A1c - Comprehensive metabolic panel - Insulin, random  4. At risk for osteoporosis Judith [name] was given approximately 15 minutes of osteoporosis prevention counseling today. Judith [name] is at risk for osteopenia and osteoporosis due to her Vitamin D deficiency. Judith [name] was encouraged to take her Vitamin D and follow her higher calcium diet and increase strengthening exercise to help strengthen her bones and decrease her risk of osteopenia and osteoporosis.  Repetitive spaced learning was employed today to elicit superior memory formation and behavioral change.  5. Class 1 obesity with serious comorbidity and body mass index (BMI) of 30.0 to 30.9 in adult, unspecified obesity type, current bmi 27.45 Rheba is currently in the action stage of change. As such, her goal is to continue with weight loss efforts. Judith [name] has agreed to the Category 2 Plan.   Exercise goals: No exercise has been prescribed at this time.  Behavioral modification strategies: meal planning and cooking strategies and keeping healthy foods in the home.  Judith [name] has agreed to follow-up with our clinic in 16 weeks. Judith [name] was informed of the importance of frequent follow-up visits to maximize her success with intensive lifestyle modifications for her multiple  health conditions.   Judith [name] was informed we would discuss her lab results at her next visit unless there is a critical issue that needs to be addressed sooner. Judith [name] agreed to keep her next visit at  the agreed upon time to discuss these results.  Objective:   Blood pressure 122/70, pulse 80, temperature 98.9 F (37.2 C), height '5\' 4"'$  (1.626 m), weight 160 lb (72.6 kg), SpO2 98 %. Body mass index is 27.46 kg/m.  General: Cooperative, alert, well developed, in no acute distress. HEENT: Conjunctivae and lids unremarkable. Cardiovascular: Regular rhythm.  Lungs: Normal work of breathing. Neurologic: No focal deficits.   Lab Results  Component Value Date   CREATININE 0.86 11/23/2020   BUN 11 11/23/2020   NA 143 11/23/2020   K 5.5 (H) 11/23/2020   CL 106 11/23/2020   CO2 24 11/23/2020   Lab Results  Component Value Date   ALT 20 11/23/2020   AST 18 11/23/2020   ALKPHOS 94 11/23/2020   BILITOT 0.4 11/23/2020   Lab Results  Component Value Date   HGBA1C 5.0 11/23/2020   HGBA1C 5.2 02/12/2020   HGBA1C 4.7 (L) 05/21/2019   HGBA1C 5.1 11/01/2018   HGBA1C 5.2 05/06/2018   Lab Results  Component Value Date   INSULIN 2.5 (L) 11/23/2020   INSULIN 3.8 02/12/2020   INSULIN 33.0 (H) 05/21/2019   INSULIN 3.4 11/01/2018   INSULIN 6.4 05/06/2018   Lab Results  Component Value Date   TSH 2.35 02/20/2020   Lab Results  Component Value Date   CHOL 201 (H) 11/23/2020   HDL 68 11/23/2020   LDLCALC 117 (H) 11/23/2020   TRIG 87 11/23/2020   CHOLHDL 3.0 11/23/2020   Lab Results  Component Value Date   VD25OH 56.4 11/23/2020   VD25OH 60 02/20/2020   VD25OH 70.1 02/12/2020   Lab Results  Component Value Date   WBC 6.6 02/20/2020   HGB 14.2 02/20/2020   HCT 43.0 02/20/2020   MCV 92.7 02/20/2020   PLT 268 02/20/2020   No results found for: IRON, TIBC, FERRITIN  Attestation Statements:   Reviewed by clinician on day of visit: allergies, medications, problem list, medical history, surgical history, family history, social history, and previous encounter notes.   Wilhemena Durie, am acting as transcriptionist for [employer], PA-C.  I have reviewed the above  documentation for accuracy and completeness, and I agree with the above. Abby Potash, PA-C

## 2021-02-25 ENCOUNTER — Encounter: Payer: BC Managed Care – PPO | Admitting: Family Medicine

## 2021-02-28 ENCOUNTER — Ambulatory Visit (INDEPENDENT_AMBULATORY_CARE_PROVIDER_SITE_OTHER): Payer: BC Managed Care – PPO | Admitting: Family Medicine

## 2021-02-28 ENCOUNTER — Other Ambulatory Visit: Payer: Self-pay

## 2021-02-28 ENCOUNTER — Encounter: Payer: Self-pay | Admitting: Family Medicine

## 2021-02-28 VITALS — BP 118/78 | HR 70 | Temp 98.2°F | Resp 18 | Ht 64.0 in | Wt 169.2 lb

## 2021-02-28 DIAGNOSIS — Z0000 Encounter for general adult medical examination without abnormal findings: Secondary | ICD-10-CM | POA: Diagnosis not present

## 2021-02-28 DIAGNOSIS — M8580 Other specified disorders of bone density and structure, unspecified site: Secondary | ICD-10-CM | POA: Diagnosis not present

## 2021-02-28 DIAGNOSIS — Z23 Encounter for immunization: Secondary | ICD-10-CM

## 2021-02-28 LAB — LIPID PANEL
Cholesterol: 176 mg/dL (ref 0–200)
HDL: 70.1 mg/dL (ref >39.00)
LDL Cholesterol: 89 mg/dL (ref 0–99)
NonHDL: 106.19
Total CHOL/HDL Ratio: 3
Triglycerides: 88 mg/dL (ref 0.0–149.0)
VLDL: 17.6 mg/dL (ref 0.0–40.0)

## 2021-02-28 LAB — COMPREHENSIVE METABOLIC PANEL
ALT: 17 U/L (ref 0–35)
AST: 19 U/L (ref 0–37)
Albumin: 4.2 g/dL (ref 3.5–5.2)
Alkaline Phosphatase: 75 U/L (ref 39–117)
BUN: 11 mg/dL (ref 6–23)
CO2: 29 mEq/L (ref 19–32)
Calcium: 9.8 mg/dL (ref 8.4–10.5)
Chloride: 105 mEq/L (ref 96–112)
Creatinine, Ser: 0.67 mg/dL (ref 0.40–1.20)
GFR: 93.27 mL/min (ref >60.00)
Glucose, Bld: 85 mg/dL (ref 70–99)
Potassium: 4.7 mEq/L (ref 3.5–5.1)
Sodium: 141 mEq/L (ref 135–145)
Total Bilirubin: 0.5 mg/dL (ref 0.2–1.2)
Total Protein: 6.5 g/dL (ref 6.0–8.3)

## 2021-02-28 LAB — CBC WITH DIFFERENTIAL/PLATELET
Basophils Absolute: 0 10*3/uL (ref 0.0–0.1)
Basophils Relative: 0.5 % (ref 0.0–3.0)
Eosinophils Absolute: 0.2 10*3/uL (ref 0.0–0.7)
Eosinophils Relative: 4.3 % (ref 0.0–5.0)
HCT: 42.5 % (ref 36.0–46.0)
Hemoglobin: 14.2 g/dL (ref 12.0–15.0)
Lymphocytes Relative: 46.8 % — ABNORMAL HIGH (ref 12.0–46.0)
Lymphs Abs: 2.2 10*3/uL (ref 0.7–4.0)
MCHC: 33.3 g/dL (ref 30.0–36.0)
MCV: 91.7 fl (ref 78.0–100.0)
Monocytes Absolute: 0.4 10*3/uL (ref 0.1–1.0)
Monocytes Relative: 8.6 % (ref 3.0–12.0)
Neutro Abs: 1.8 10*3/uL (ref 1.4–7.7)
Neutrophils Relative %: 39.8 % — ABNORMAL LOW (ref 43.0–77.0)
Platelets: 286 10*3/uL (ref 150.0–400.0)
RBC: 4.63 Mil/uL (ref 3.87–5.11)
RDW: 13.6 % (ref 11.5–15.5)
WBC: 4.6 10*3/uL (ref 4.0–10.5)

## 2021-02-28 LAB — TSH: TSH: 2.7 u[IU]/mL (ref 0.35–5.50)

## 2021-02-28 NOTE — Progress Notes (Signed)
Subjective:     Judith [name] is a 65 y.o. female and is here for a comprehensive physical exam. The patient reports no problems.  Social History   Socioeconomic History   Marital status: Married    Spouse name: Juanda Crumble   Number of children: 0   Years of education: Not on file   Highest education level: Not on file  Occupational History   Occupation: Pharmacist, hospital    Comment: [employer]   Tobacco Use   Smoking status: Former    Packs/day: 0.20    Years: 5.00    Pack years: 1.00    Types: Cigarettes    Quit date: 09/09/1986    Years since quitting: 34.4   Smokeless tobacco: Never  Vaping Use   Vaping Use: Never used  Substance and Sexual Activity   Alcohol use: Yes    Alcohol/week: 5.0 standard drinks    Types: 5 Glasses of wine per week   Drug use: No   Sexual activity: Yes    Partners: Male  Other Topics Concern   Not on file  Social History Narrative   Exercise--walk with dog   Social Determinants of Health   Financial Resource Strain: Not on file  Food Insecurity: Not on file  Transportation Needs: Not on file  Physical Activity: Not on file  Stress: Not on file  Social Connections: Not on file  Intimate Partner Violence: Not on file   Health Maintenance  Topic Date Due   COVID-19 Vaccine (3 - Booster for Moderna series) 09/10/2019   INFLUENZA VACCINE  12/06/2020   HIV Screening  10/11/2027 (Originally 02/12/1971)   TETANUS/TDAP  09/08/2021   MAMMOGRAM  09/09/2021   PAP SMEAR-Modifier  02/16/2022   COLONOSCOPY (Pts 45-23yrs Insurance coverage will need to be confirmed)  11/06/2030   Hepatitis C Screening  Completed   Zoster Vaccines- Shingrix  Completed   Pneumococcal Vaccine 63-65 Years old  Aged Out   HPV VACCINES  Aged Out    The following portions of the patient's history were reviewed and updated as appropriate: She  has a past medical history of Obesity (2015). She does not have any pertinent problems on file. She  has no  past surgical history on file. Her family history includes Arthritis in her father; Cancer in her father; Cancer (age of onset: 34) in her mother. She  reports that she quit smoking about 34 years ago. Her smoking use included cigarettes. She has a 1.00 pack-year smoking history. She has never used smokeless tobacco. She reports current alcohol use of about 5.0 standard drinks per week. She reports that she does not use drugs. She has a current medication list which includes the following prescription(s): calcium carb-cholecalciferol, calcium-magnesium-vitamin d, vitamin d3, fish oil, and vitamin c. Current Outpatient Medications on File Prior to Visit  Medication Sig Dispense Refill   Calcium Carb-Cholecalciferol (CALCIUM 500/D PO) Take by mouth.     Calcium-Magnesium-Vitamin D (CALCIUM 1200+D3 PO) Take 1 tablet by mouth daily. 1000U vit d     Cholecalciferol (VITAMIN D3) 125 MCG (5000 UT) CAPS Take 2,500 Units by mouth every other day.     Omega-3 Fatty Acids (FISH OIL) 1200 MG CAPS Take 1 capsule by mouth daily.     vitamin C (ASCORBIC ACID) 500 MG tablet Take 1,000 mg by mouth daily.     No current facility-administered medications on file prior to visit.   She has No Known Allergies..  Review of Systems Review of Systems  Constitutional: Negative for activity change, appetite change and fatigue.  HENT: Negative for hearing loss, congestion, tinnitus and ear discharge.  dentist q70m Eyes: Negative for visual disturbance (see optho q1y -- vision corrected to 20/20 with glasses).  Respiratory: Negative for cough, chest tightness and shortness of breath.   Cardiovascular: Negative for chest pain, palpitations and leg swelling.  Gastrointestinal: Negative for abdominal pain, diarrhea, constipation and abdominal distention.  Genitourinary: Negative for urgency, frequency, decreased urine volume and difficulty urinating.  Musculoskeletal: Negative for back pain, arthralgias and gait problem.   Skin: Negative for color change, pallor and rash.  Neurological: Negative for dizziness, light-headedness, numbness and headaches.  Hematological: Negative for adenopathy. Does not bruise/bleed easily.  Psychiatric/Behavioral: Negative for suicidal ideas, confusion, sleep disturbance, self-injury, dysphoric mood, decreased concentration and agitation.      Objective:    BP 118/78 (BP Location: Left Arm, Patient Position: Sitting, Cuff Size: Normal)   Pulse 70   Temp 98.2 F (36.8 C) (Oral)   Resp 18   Ht 5\' 4"  (1.626 m)   Wt 169 lb 3.2 oz (76.7 kg)   SpO2 98%   BMI 29.04 kg/m  General appearance: alert, cooperative, appears stated age, and no distress Head: Normocephalic, without obvious abnormality, atraumatic Eyes: negative findings: lids and lashes normal, conjunctivae and sclerae normal, and pupils equal, round, reactive to light and accomodation Ears: normal TM's and external ear canals both ears Neck: no adenopathy, no carotid bruit, no JVD, supple, symmetrical, trachea midline, and thyroid not enlarged, symmetric, no tenderness/mass/nodules Back: symmetric, no curvature. ROM normal. No CVA tenderness. Lungs: clear to auscultation bilaterally Heart: regular rate and rhythm, S1, S2 normal, no murmur, click, rub or gallop Abdomen: soft, non-tender; bowel sounds normal; no masses,  no organomegaly Pelvic: deferred Extremities: extremities normal, atraumatic, no cyanosis or edema Pulses: 2+ and symmetric Skin: Skin color, texture, turgor normal. No rashes or lesions Lymph nodes: Cervical, supraclavicular, and axillary nodes normal. Neurologic: Alert and oriented X 3, normal strength and tone. Normal symmetric reflexes. Normal coordination and gait    Assessment:    Healthy female exam.      Plan:  Ghm utd Check labs    See After Visit Summary for Counseling Recommendations   1. Need for influenza vaccination  - Flu Vaccine QUAD 64mo+IM (Fluarix, Fluzone & Alfiuria  Quad PF)  2. Preventative health care See above - CBC with Differential/Platelet - Comprehensive metabolic panel - Lipid panel - TSH

## 2021-02-28 NOTE — Patient Instructions (Signed)
Preventive Care 40-64 Years Old, Female Preventive care refers to lifestyle choices and visits with your health care provider that can promote health and wellness. This includes: A yearly physical exam. This is also called an annual wellness visit. Regular dental and eye exams. Immunizations. Screening for certain conditions. Healthy lifestyle choices, such as: Eating a healthy diet. Getting regular exercise. Not using drugs or products that contain nicotine and tobacco. Limiting alcohol use. What can I expect for my preventive care visit? Physical exam Your health care provider will check your: Height and weight. These may be used to calculate your BMI (body mass index). BMI is a measurement that tells if you are at a healthy weight. Heart rate and blood pressure. Body temperature. Skin for abnormal spots. Counseling Your health care provider may ask you questions about your: Past medical problems. Family's medical history. Alcohol, tobacco, and drug use. Emotional well-being. Home life and relationship well-being. Sexual activity. Diet, exercise, and sleep habits. Work and work environment. Access to firearms. Method of birth control. Menstrual cycle. Pregnancy history. What immunizations do I need? Vaccines are usually given at various ages, according to a schedule. Your health care provider will recommend vaccines for you based on your age, medical history, and lifestyle or other factors, such as travel or where you work. What tests do I need? Blood tests Lipid and cholesterol levels. These may be checked every 5 years, or more often if you are over 50 years old. Hepatitis C test. Hepatitis B test. Screening Lung cancer screening. You may have this screening every year starting at age 55 if you have a 30-pack-year history of smoking and currently smoke or have quit within the past 15 years. Colorectal cancer screening. All adults should have this screening starting at  age 50 and continuing until age 75. Your health care provider may recommend screening at age 45 if you are at increased risk. You will have tests every 1-10 years, depending on your results and the type of screening test. Diabetes screening. This is done by checking your blood sugar (glucose) after you have not eaten for a while (fasting). You may have this done every 1-3 years. Mammogram. This may be done every 1-2 years. Talk with your health care provider about when you should start having regular mammograms. This may depend on whether you have a family history of breast cancer. BRCA-related cancer screening. This may be done if you have a family history of breast, ovarian, tubal, or peritoneal cancers. Pelvic exam and Pap test. This may be done every 3 years starting at age 21. Starting at age 30, this may be done every 5 years if you have a Pap test in combination with an HPV test. Other tests STD (sexually transmitted disease) testing, if you are at risk. Bone density scan. This is done to screen for osteoporosis. You may have this scan if you are at high risk for osteoporosis. Talk with your health care provider about your test results, treatment options, and if necessary, the need for more tests. Follow these instructions at home: Eating and drinking  Eat a diet that includes fresh fruits and vegetables, whole grains, lean protein, and low-fat dairy products. Take vitamin and mineral supplements as recommended by your health care provider. Do not drink alcohol if: Your health care provider tells you not to drink. You are pregnant, may be pregnant, or are planning to become pregnant. If you drink alcohol: Limit how much you have to 0-1 drink a day. Be   aware of how much alcohol is in your drink. In the [place] one drink equals one 12 oz bottle of beer (355 mL), one 5 oz glass of wine (148 mL), or one 1 oz glass of hard liquor (44 mL). Lifestyle Take daily care of your teeth and  gums. Brush your teeth every morning and night with fluoride toothpaste. Floss one time each day. Stay active. Exercise for at least 30 minutes 5 or more days each week. Do not use any products that contain nicotine or tobacco, such as cigarettes, e-cigarettes, and chewing tobacco. If you need help quitting, ask your health care provider. Do not use drugs. If you are sexually active, practice safe sex. Use a condom or other form of protection to prevent STIs (sexually transmitted infections). If you do not wish to become pregnant, use a form of birth control. If you plan to become pregnant, see your health care provider for a prepregnancy visit. If told by your health care provider, take low-dose aspirin daily starting at age 63. Find healthy ways to cope with stress, such as: Meditation, yoga, or listening to music. Journaling. Talking to a trusted person. Spending time with friends and family. Safety Always wear your seat belt while driving or riding in a vehicle. Do not drive: If you have been drinking alcohol. Do not ride with someone who has been drinking. When you are tired or distracted. While texting. Wear a helmet and other protective equipment during sports activities. If you have firearms in your house, make sure you follow all gun safety procedures. What's next? Visit your health care provider once a year for an annual wellness visit. Ask your health care provider how often you should have your eyes and teeth checked. Stay up to date on all vaccines. This information is not intended to replace advice given to you by your health care provider. Make sure you discuss any questions you have with your health care provider. Document Revised: 07/02/2020 Document Reviewed: 01/03/2018 Elsevier Patient Education  2022 [employer].

## 2021-03-24 ENCOUNTER — Ambulatory Visit (INDEPENDENT_AMBULATORY_CARE_PROVIDER_SITE_OTHER): Payer: BC Managed Care – PPO | Admitting: Adult Health

## 2021-03-28 ENCOUNTER — Ambulatory Visit (INDEPENDENT_AMBULATORY_CARE_PROVIDER_SITE_OTHER): Payer: BC Managed Care – PPO | Admitting: Physician Assistant

## 2021-06-10 ENCOUNTER — Other Ambulatory Visit (HOSPITAL_BASED_OUTPATIENT_CLINIC_OR_DEPARTMENT_OTHER): Payer: Self-pay

## 2021-06-10 ENCOUNTER — Ambulatory Visit: Payer: BC Managed Care – PPO | Attending: Internal Medicine

## 2021-06-10 DIAGNOSIS — Z23 Encounter for immunization: Secondary | ICD-10-CM

## 2021-06-10 MED ORDER — MODERNA COVID-19 BIVAL BOOSTER 50 MCG/0.5ML IM SUSP
INTRAMUSCULAR | 0 refills | Status: DC
  Filled 2021-06-10: qty 0.5, 1d supply, fill #0

## 2021-06-10 NOTE — Progress Notes (Signed)
° °  Covid-19 Vaccination Clinic  Name:  Judith [name]    MRN: [id number] DOB: 1955/12/03  06/10/2021  [name] was observed post Covid-19 immunization for 15 minutes without incident. She was provided with Vaccine Information Sheet and instruction to access the V-Safe system.   [name] was instructed to call 911 with any severe reactions post vaccine: Difficulty breathing  Swelling of face and throat  A fast heartbeat  A bad rash all over body  Dizziness and weakness   Immunizations Administered     Name Date Dose VIS Date Route   Moderna Covid-19 vaccine Bivalent Booster 06/10/2021 12:34 PM 0.5 mL 12/18/2020 Intramuscular   Manufacturer: Levan Hurst   Lot: [id number]   [place]: [id number]

## 2021-10-13 IMAGING — MG MM DIGITAL DIAGNOSTIC UNILAT*L* W/ TOMO W/ CAD
4 series · 4 of 12 positions shown · non-contrast
Comparison: Previous exam(s).

CLINICAL DATA: Patient returns after screening study for evaluation
of possible LEFT breast asymmetry.

EXAM:
DIGITAL DIAGNOSTIC UNILATERAL LEFT MAMMOGRAM WITH TOMOSYNTHESIS AND
CAD
TECHNIQUE: Left digital diagnostic mammography and breast tomosynthesis was
performed. The images were evaluated with computer-aided detection.

[L CC synth-2D]
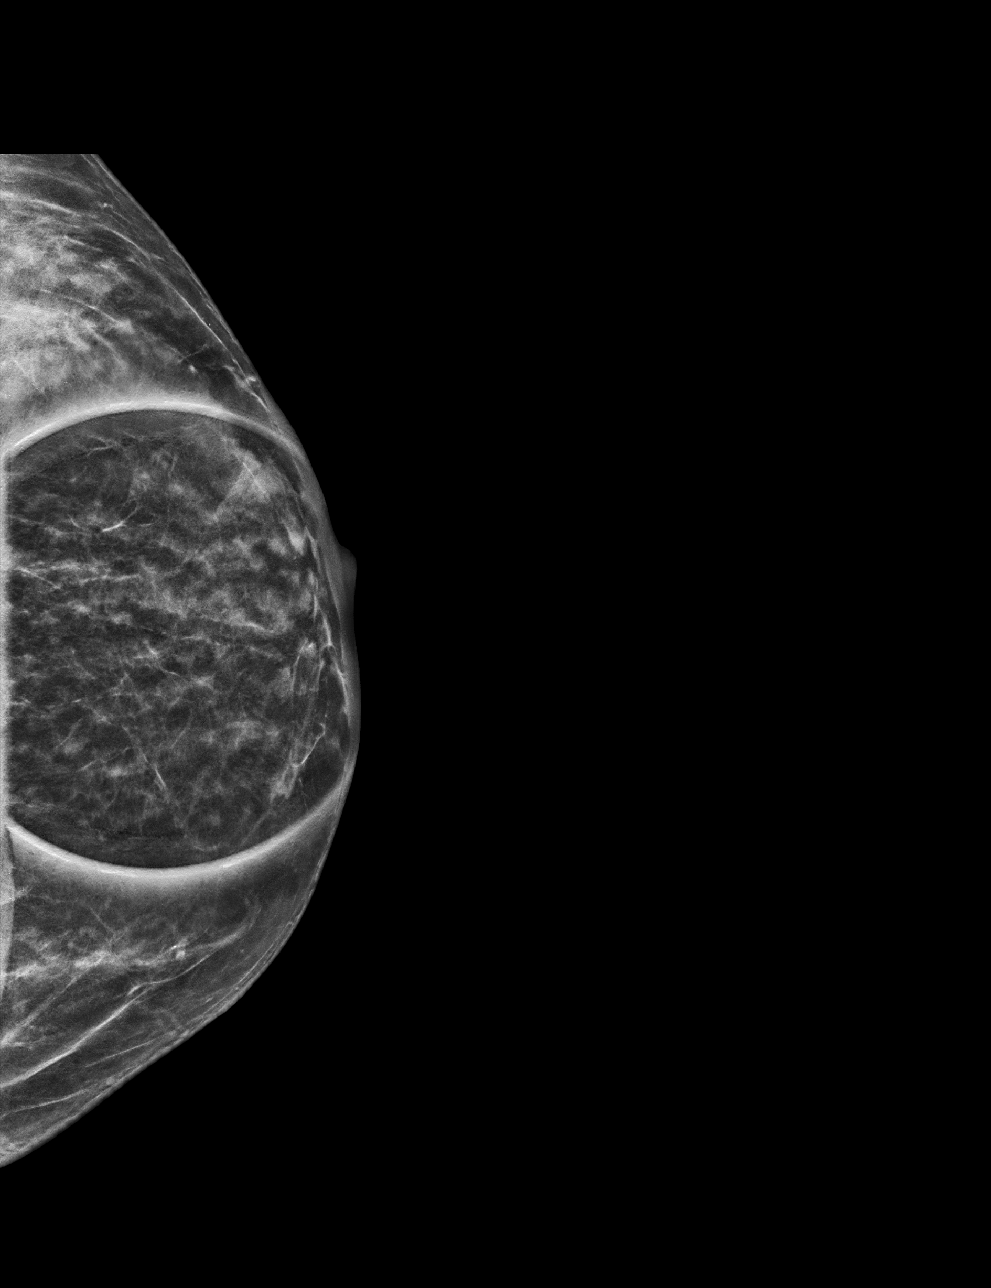

[L MLO synth-2D]
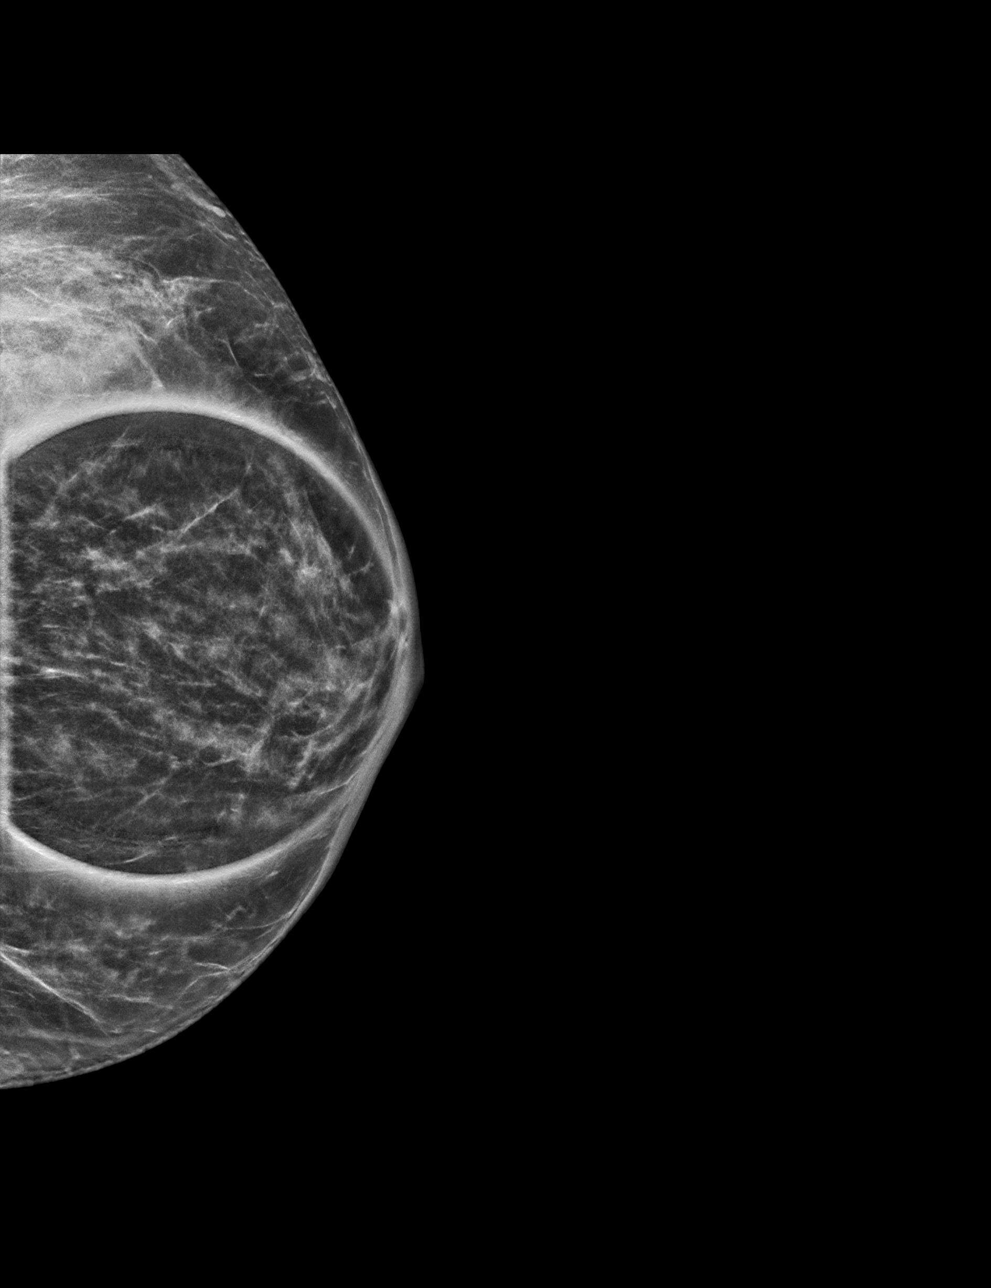

[L CC tomo · tomo slice 20/39.0]
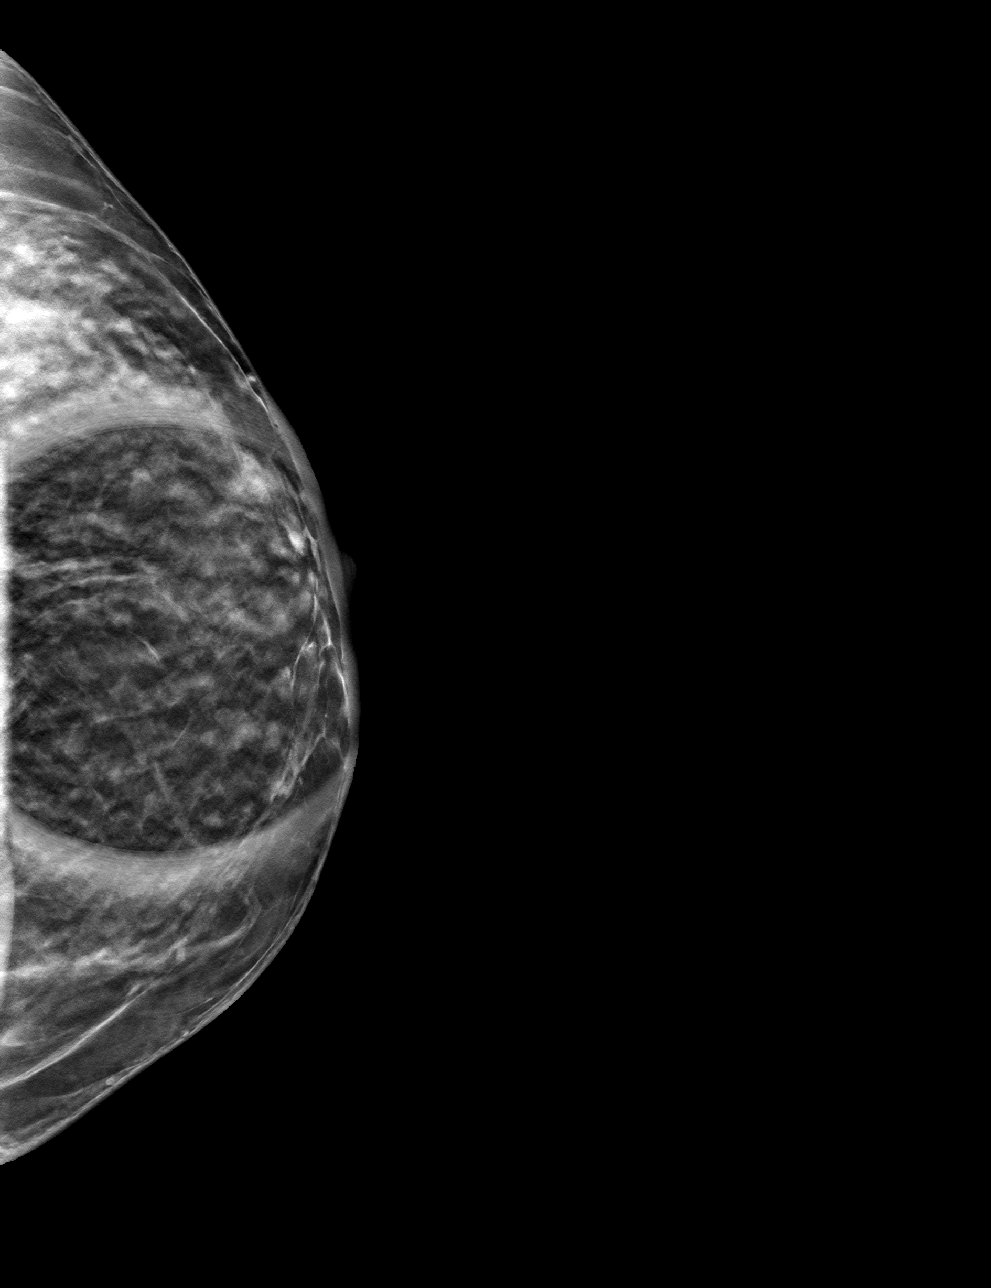

[L MLO tomo · tomo slice 23/46.0]
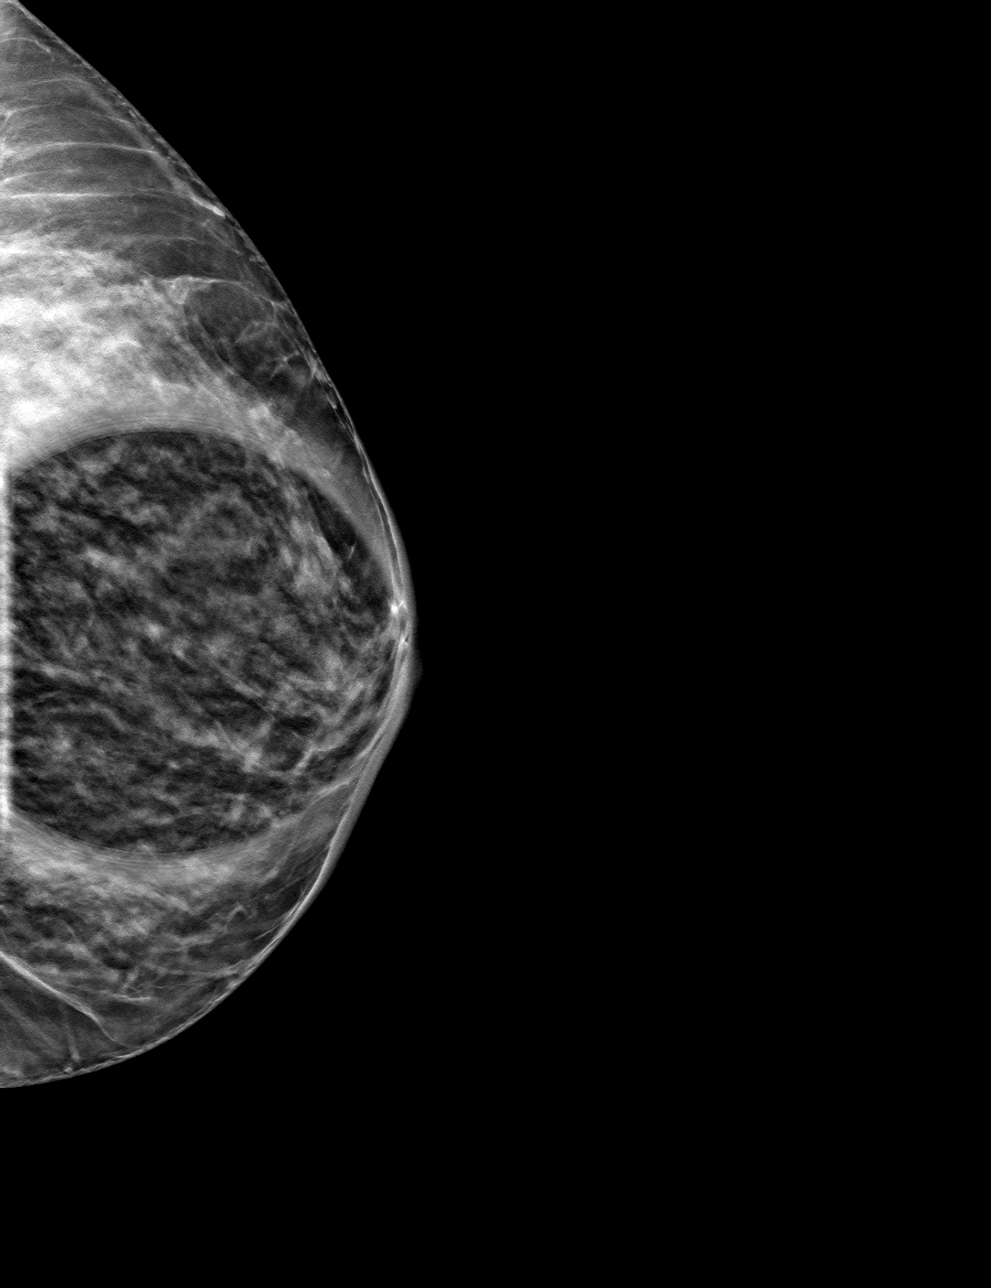

[4 of 12 positions shown; findings below may reference images not displayed]

ACR Breast Density Category c: The breast tissue is heterogeneously
dense, which may obscure small masses.
FINDINGS: Additional 2-D and 3-D images are performed. These views show no
persistent asymmetry in the retroareolar region of the LEFT breast.
No suspicious mass, distortion, or microcalcifications are
identified to suggest presence of malignancy.
IMPRESSION: No mammographic evidence for malignancy.

RECOMMENDATION:
Screening mammogram in one year.(Code:[id number])

I have discussed the findings and recommendations with the patient.
If applicable, a reminder letter will be sent to the patient
regarding the next appointment.

BI-RADS CATEGORY  1: Negative.

## 2021-12-14 ENCOUNTER — Encounter (INDEPENDENT_AMBULATORY_CARE_PROVIDER_SITE_OTHER): Payer: Self-pay

## 2022-03-02 ENCOUNTER — Encounter: Payer: BC Managed Care – PPO | Admitting: Family Medicine

## 2022-03-10 ENCOUNTER — Ambulatory Visit (INDEPENDENT_AMBULATORY_CARE_PROVIDER_SITE_OTHER): Payer: BC Managed Care – PPO | Admitting: Family Medicine

## 2022-03-10 ENCOUNTER — Encounter: Payer: Self-pay | Admitting: Family Medicine

## 2022-03-10 ENCOUNTER — Other Ambulatory Visit (HOSPITAL_COMMUNITY)
Admission: RE | Admit: 2022-03-10 | Discharge: 2022-03-10 | Disposition: A | Discharge status: Home / Self Care | Payer: BC Managed Care – PPO | Source: Ambulatory Visit | Attending: Family Medicine | Admitting: Family Medicine

## 2022-03-10 VITALS — BP 108/80 | HR 64 | Temp 97.8°F | Resp 16 | Ht 64.0 in | Wt 190.8 lb

## 2022-03-10 DIAGNOSIS — Z0000 Encounter for general adult medical examination without abnormal findings: Secondary | ICD-10-CM | POA: Insufficient documentation

## 2022-03-10 DIAGNOSIS — Z23 Encounter for immunization: Secondary | ICD-10-CM | POA: Diagnosis not present

## 2022-03-10 DIAGNOSIS — E7849 Other hyperlipidemia: Secondary | ICD-10-CM

## 2022-03-10 LAB — CBC WITH DIFFERENTIAL/PLATELET
Basophils Absolute: 0 10*3/uL (ref 0.0–0.1)
Basophils Relative: 0.6 % (ref 0.0–3.0)
Eosinophils Absolute: 0.2 10*3/uL (ref 0.0–0.7)
Eosinophils Relative: 2.9 % (ref 0.0–5.0)
HCT: 42.3 % (ref 36.0–46.0)
Hemoglobin: 14.2 g/dL (ref 12.0–15.0)
Lymphocytes Relative: 37.7 % (ref 12.0–46.0)
Lymphs Abs: 2.3 10*3/uL (ref 0.7–4.0)
MCHC: 33.6 g/dL (ref 30.0–36.0)
MCV: 92.1 fl (ref 78.0–100.0)
Monocytes Absolute: 0.5 10*3/uL (ref 0.1–1.0)
Monocytes Relative: 8.4 % (ref 3.0–12.0)
Neutro Abs: 3 10*3/uL (ref 1.4–7.7)
Neutrophils Relative %: 50.4 % (ref 43.0–77.0)
Platelets: 338 10*3/uL (ref 150.0–400.0)
RBC: 4.6 Mil/uL (ref 3.87–5.11)
RDW: 13.8 % (ref 11.5–15.5)
WBC: 6 10*3/uL (ref 4.0–10.5)

## 2022-03-10 LAB — COMPREHENSIVE METABOLIC PANEL
ALT: 16 U/L (ref 0–35)
AST: 19 U/L (ref 0–37)
Albumin: 4.3 g/dL (ref 3.5–5.2)
Alkaline Phosphatase: 83 U/L (ref 39–117)
BUN: 12 mg/dL (ref 6–23)
CO2: 29 mEq/L (ref 19–32)
Calcium: 9.5 mg/dL (ref 8.4–10.5)
Chloride: 103 mEq/L (ref 96–112)
Creatinine, Ser: 0.64 mg/dL (ref 0.40–1.20)
GFR: 93.62 mL/min (ref >60.00)
Glucose, Bld: 88 mg/dL (ref 70–99)
Potassium: 4.8 mEq/L (ref 3.5–5.1)
Sodium: 139 mEq/L (ref 135–145)
Total Bilirubin: 0.4 mg/dL (ref 0.2–1.2)
Total Protein: 6.6 g/dL (ref 6.0–8.3)

## 2022-03-10 LAB — VITAMIN D 25 HYDROXY (VIT D DEFICIENCY, FRACTURES): VITD: 48.75 ng/mL (ref 30.00–100.00)

## 2022-03-10 LAB — LIPID PANEL
Cholesterol: 181 mg/dL (ref 0–200)
HDL: 66.5 mg/dL (ref >39.00)
LDL Cholesterol: 103 mg/dL — ABNORMAL HIGH (ref 0–99)
NonHDL: 114.93
Total CHOL/HDL Ratio: 3
Triglycerides: 60 mg/dL (ref 0.0–149.0)
VLDL: 12 mg/dL (ref 0.0–40.0)

## 2022-03-10 LAB — TSH: TSH: 2.12 u[IU]/mL (ref 0.35–5.50)

## 2022-03-10 NOTE — Assessment & Plan Note (Signed)
Ghm utd Check labs  See avs 

## 2022-03-10 NOTE — Assessment & Plan Note (Signed)
Encourage heart healthy diet such as MIND or DASH diet, increase exercise, avoid trans fats, simple carbohydrates and processed foods, consider a krill or fish or flaxseed oil cap daily.  °

## 2022-03-10 NOTE — Progress Notes (Addendum)
Subjective:   By signing my name below, I, [name], attest that this documentation has been prepared under the direction and in the presence of [name], 03/10/2022.   Patient ID: Judith [name], female    DOB: 26-Jul-1955, 66 y.o.   MRN: [id number]  Chief Complaint  Patient presents with   Annual Exam    Pt states fasting     HPI Patient is in today for a comprehensive physical exam.  She denies new moles, itching, chills, fever, hearing loss, sinus pain, congestion, sore throat, cough and hemoptysis, chest pain, palpitations, wheezing, constipation, diarrhea, blood in stool, nausea and vomiting, dysuria, frequency, hematuria, myalgias and joint pain, depression, anxiety.  Social history- There are no changes in social history. Colonoscopy last completed on 11/05/2020. Dexa last completed on 09/09/2020. Immunizations- She is receiving an influenza and tetanus vaccines this visit. Pap smear last completed on 02/17/2019. Mammogram last completed on 10/29/2020.   Health Maintenance Due  Topic Date Due   MAMMOGRAM  09/09/2021   PAP SMEAR-Modifier  02/16/2022    Past Medical History:  Diagnosis Date   Obesity 2015   95 lb wt loss 01/2018-04/2019    No past surgical history on file.  Family History  Problem Relation Age of Onset   Cancer Mother 58       lymphoma   Cancer Father        renal 1969, 1999   Arthritis Father    Colon cancer Neg Hx    Colon polyps Neg Hx    Esophageal cancer Neg Hx    Rectal cancer Neg Hx    Stomach cancer Neg Hx     Social History   Socioeconomic History   Marital status: Married    Spouse name: Charles   Number of children: 0   Years of education: Not on file   Highest education level: Not on file  Occupational History   Occupation: Pharmacist, hospital    Comment: [employer]   Tobacco Use   Smoking status: Former    Packs/day: 0.20    Years: 5.00    Total pack years: 1.00    Types:  Cigarettes    Quit date: 09/09/1986    Years since quitting: 35.5   Smokeless tobacco: Never  Vaping Use   Vaping Use: Never used  Substance and Sexual Activity   Alcohol use: Yes    Alcohol/week: 5.0 standard drinks of alcohol    Types: 5 Glasses of wine per week   Drug use: No   Sexual activity: Yes    Partners: Male  Other Topics Concern   Not on file  Social History Narrative   Exercise--walk with dog   Social Determinants of Health   Financial Resource Strain: Not on file  Food Insecurity: Not on file  Transportation Needs: Not on file  Physical Activity: Not on file  Stress: Not on file  Social Connections: Not on file  Intimate Partner Violence: Not on file    Outpatient Medications Prior to Visit  Medication Sig Dispense Refill   Calcium Carb-Cholecalciferol (CALCIUM 500/D PO) Take by mouth.     Calcium-Magnesium-Vitamin D (CALCIUM 1200+D3 PO) Take 1 tablet by mouth daily. 1000U vit d     Cholecalciferol (VITAMIN D3) 125 MCG (5000 UT) CAPS Take 2,500 Units by mouth every other day.     Omega-3 Fatty Acids (FISH OIL) 1200 MG CAPS Take 1 capsule by mouth daily.     vitamin C (ASCORBIC  ACID) 500 MG tablet Take 1,000 mg by mouth daily.     COVID-19 mRNA bivalent vaccine, Moderna, (MODERNA COVID-19 BIVAL BOOSTER) 50 MCG/0.5ML injection Inject into the muscle. (Patient not taking: Reported on 03/10/2022) 0.5 mL 0   No facility-administered medications prior to visit.    No Known Allergies  Review of Systems  Constitutional:  Negative for chills, fever and malaise/fatigue.  HENT:  Negative for congestion, hearing loss, sinus pain and sore throat.   Eyes:  Negative for discharge.  Respiratory:  Negative for cough, hemoptysis, sputum production, shortness of breath and wheezing.   Cardiovascular:  Negative for chest pain, palpitations and leg swelling.  Gastrointestinal:  Negative for abdominal pain, blood in stool, constipation, diarrhea, heartburn, nausea and vomiting.   Genitourinary:  Negative for dysuria, frequency, hematuria and urgency.  Musculoskeletal:  Negative for back pain, falls, joint pain and myalgias.  Skin:  Negative for itching and rash.       (-) new moles  Neurological:  Negative for dizziness, sensory change, loss of consciousness, weakness and headaches.  Endo/Heme/Allergies:  Negative for environmental allergies. Does not bruise/bleed easily.  Psychiatric/Behavioral:  Negative for depression and suicidal ideas. The patient is not nervous/anxious and does not have insomnia.        Objective:    Physical Exam Vitals and nursing note reviewed.  Constitutional:      General: She is not in acute distress.    Appearance: Normal appearance. She is not ill-appearing.  HENT:     Head: Normocephalic and atraumatic.     Right Ear: Tympanic membrane, ear canal and external ear normal.     Left Ear: Tympanic membrane, ear canal and external ear normal.  Eyes:     Extraocular Movements: Extraocular movements intact.     Pupils: Pupils are equal, round, and reactive to light.  Cardiovascular:     Rate and Rhythm: Normal rate and regular rhythm.     Heart sounds: Normal heart sounds. No murmur heard.    No gallop.  Pulmonary:     Effort: Pulmonary effort is normal. No respiratory distress.     Breath sounds: Normal breath sounds. No wheezing or rales.  Abdominal:     General: Bowel sounds are normal. There is no distension.     Palpations: Abdomen is soft.     Tenderness: There is no abdominal tenderness. There is no guarding.  Musculoskeletal:        General: Normal range of motion.  Skin:    General: Skin is warm and dry.  Neurological:     General: No focal deficit present.     Mental Status: She is alert and oriented to person, place, and time.  Psychiatric:        Mood and Affect: Mood normal.        Behavior: Behavior normal.        Thought Content: Thought content normal.        Judgment: Judgment normal.     BP 108/80  (BP Location: Left Arm, Patient Position: Sitting, Cuff Size: Normal)   Pulse 64   Temp 97.8 F (36.6 C) (Oral)   Resp 16   Ht '5\' 4"'$  (1.626 m)   Wt 190 lb 12.8 oz (86.5 kg)   SpO2 98%   BMI 32.75 kg/m  Wt Readings from Last 3 Encounters:  03/10/22 190 lb 12.8 oz (86.5 kg)  02/28/21 169 lb 3.2 oz (76.7 kg)  11/23/20 160 lb (72.6 kg)  Assessment & Plan:   Problem List Items Addressed This Visit       Unprioritized   Preventative health care - Primary    Ghm utd Check labs  See avs      Relevant Orders   Cytology - PAP( [place])   CBC with Differential/Platelet   Comprehensive metabolic panel   Lipid panel   TSH   VITAMIN D 25 Hydroxy (Vit-D Deficiency, Fractures)   Other hyperlipidemia    Encourage heart healthy diet such as MIND or DASH diet, increase exercise, avoid trans fats, simple carbohydrates and processed foods, consider a krill or fish or flaxseed oil cap daily.        Other Visit Diagnoses     Need for influenza vaccination       Relevant Orders   Flu Vaccine QUAD 85moIM (Fluarix, Fluzone & Alfiuria Quad PF) (Completed)   Need for Tdap vaccination       Relevant Orders   Tdap vaccine greater than or equal to 7yo IM (Completed)      No orders of the defined types were placed in this encounter.   I, LRoma Schanz personally preformed the services described in this documentation.  All medical record entries made by the scribe were at my direction and in my presence.  I have reviewed the chart and discharge instructions (if applicable) and agree that the record reflects my personal performance and is accurate and complete. 03/10/2022.   I,Verona Buck,acting as a sEducation administratorfor [employer] DO.,have documented all relevant documentation on the behalf of YAnn Held DO,as directed by  YAnn Held DO while in the presence of YAnn Held DO.    YAnn Held DO

## 2022-03-13 LAB — CYTOLOGY - PAP: Diagnosis: NEGATIVE

## 2022-03-14 DIAGNOSIS — H903 Sensorineural hearing loss, bilateral: Secondary | ICD-10-CM | POA: Diagnosis not present

## 2022-03-16 ENCOUNTER — Other Ambulatory Visit: Payer: Self-pay | Admitting: Family Medicine

## 2022-03-16 DIAGNOSIS — Z1231 Encounter for screening mammogram for malignant neoplasm of breast: Secondary | ICD-10-CM

## 2022-03-20 ENCOUNTER — Encounter: Payer: Self-pay | Admitting: *Deleted

## 2022-04-14 ENCOUNTER — Ambulatory Visit
Admission: RE | Admit: 2022-04-14 | Discharge: 2022-04-14 | Disposition: A | Discharge status: Home / Self Care | Payer: BC Managed Care – PPO | Source: Ambulatory Visit | Attending: Family Medicine | Admitting: Family Medicine

## 2022-04-14 DIAGNOSIS — Z1231 Encounter for screening mammogram for malignant neoplasm of breast: Secondary | ICD-10-CM | POA: Diagnosis not present

## 2023-03-15 ENCOUNTER — Ambulatory Visit (INDEPENDENT_AMBULATORY_CARE_PROVIDER_SITE_OTHER): Payer: BC Managed Care – PPO | Admitting: Family Medicine

## 2023-03-15 ENCOUNTER — Encounter: Payer: Self-pay | Admitting: Family Medicine

## 2023-03-15 VITALS — BP 128/80 | HR 80 | Temp 98.7°F | Resp 18 | Ht 64.0 in | Wt 187.0 lb

## 2023-03-15 DIAGNOSIS — Z23 Encounter for immunization: Secondary | ICD-10-CM | POA: Diagnosis not present

## 2023-03-15 DIAGNOSIS — H9193 Unspecified hearing loss, bilateral: Secondary | ICD-10-CM | POA: Diagnosis not present

## 2023-03-15 DIAGNOSIS — E2839 Other primary ovarian failure: Secondary | ICD-10-CM | POA: Diagnosis not present

## 2023-03-15 DIAGNOSIS — E7849 Other hyperlipidemia: Secondary | ICD-10-CM

## 2023-03-15 DIAGNOSIS — Z0000 Encounter for general adult medical examination without abnormal findings: Secondary | ICD-10-CM | POA: Diagnosis not present

## 2023-03-15 LAB — CBC WITH DIFFERENTIAL/PLATELET
Basophils Absolute: 0.1 K/uL (ref 0.0–0.1)
Basophils Relative: 0.7 % (ref 0.0–3.0)
Eosinophils Absolute: 0.2 K/uL (ref 0.0–0.7)
Eosinophils Relative: 2.5 % (ref 0.0–5.0)
HCT: 43.9 % (ref 36.0–46.0)
Hemoglobin: 14.4 g/dL (ref 12.0–15.0)
Lymphocytes Relative: 20.7 % (ref 12.0–46.0)
Lymphs Abs: 1.7 K/uL (ref 0.7–4.0)
MCHC: 32.9 g/dL (ref 30.0–36.0)
MCV: 93.3 fl (ref 78.0–100.0)
Monocytes Absolute: 0.6 K/uL (ref 0.1–1.0)
Monocytes Relative: 6.8 % (ref 3.0–12.0)
Neutro Abs: 5.8 K/uL (ref 1.4–7.7)
Neutrophils Relative %: 69.3 % (ref 43.0–77.0)
Platelets: 334 K/uL (ref 150.0–400.0)
RBC: 4.71 Mil/uL (ref 3.87–5.11)
RDW: 13.9 % (ref 11.5–15.5)
WBC: 8.3 K/uL (ref 4.0–10.5)

## 2023-03-15 LAB — COMPREHENSIVE METABOLIC PANEL
ALT: 19 U/L (ref 0–35)
AST: 18 U/L (ref 0–37)
Albumin: 4.2 g/dL (ref 3.5–5.2)
Alkaline Phosphatase: 90 U/L (ref 39–117)
BUN: 14 mg/dL (ref 6–23)
CO2: 28 meq/L (ref 19–32)
Calcium: 9.5 mg/dL (ref 8.4–10.5)
Chloride: 105 meq/L (ref 96–112)
Creatinine, Ser: 0.67 mg/dL (ref 0.40–1.20)
GFR: 91.94 mL/min (ref >60.00)
Glucose, Bld: 96 mg/dL (ref 70–99)
Potassium: 4.5 meq/L (ref 3.5–5.1)
Sodium: 140 meq/L (ref 135–145)
Total Bilirubin: 0.4 mg/dL (ref 0.2–1.2)
Total Protein: 6.7 g/dL (ref 6.0–8.3)

## 2023-03-15 LAB — LIPID PANEL
Cholesterol: 193 mg/dL (ref 0–200)
HDL: 63.4 mg/dL (ref >39.00)
LDL Cholesterol: 114 mg/dL — ABNORMAL HIGH (ref 0–99)
NonHDL: 129.26
Total CHOL/HDL Ratio: 3
Triglycerides: 75 mg/dL (ref 0.0–149.0)
VLDL: 15 mg/dL (ref 0.0–40.0)

## 2023-03-15 LAB — VITAMIN D 25 HYDROXY (VIT D DEFICIENCY, FRACTURES): VITD: 57.92 ng/mL (ref 30.00–100.00)

## 2023-03-15 LAB — VITAMIN B12: Vitamin B-12: 344 pg/mL (ref 211–911)

## 2023-03-15 LAB — TSH: TSH: 1.64 u[IU]/mL (ref 0.35–5.50)

## 2023-03-15 NOTE — Progress Notes (Signed)
Q   Established Patient Office Visit  Subjective   Patient ID: Judith [name], female    DOB: January 08, 1956  Age: 67 y.o. MRN: [id number]  Chief Complaint  Patient presents with   Annual Exam    Pt states fasting     HPI Discussed the use of AI scribe software for clinical note transcription with the patient, who gave verbal consent to proceed.  History of Present Illness   The patient, with a history of significant weight loss and tinnitus, presents for a routine check-up. She reports a previous weight of 255 lbs, which decreased to 160 lbs, and has since increased to 185 lbs. She denies any recent fevers and denies having COVID-19. She also reports a history of tinnitus in both ears, which has not improved. She mentions hearing about a new treatment for tinnitus, which she is interested in exploring.  The patient also mentions a need for a referral for a hearing test, as well as a mammogram and bone density test. 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 She also mentions turning 67 recently and having to navigate Medicare. She is currently working and plans to continue until she is 17.      Patient Active Problem List   Diagnosis Date Noted   Bilateral hearing loss 03/15/2023   Estrogen deficiency 03/15/2023   Preventative health care 03/10/2022   Osteopenia 02/28/2021   Insulin resistance 10/31/2018   Other hyperlipidemia 10/31/2018   Overweight (BMI 25.0-29.9) 07/11/2018   Vitamin D deficiency 07/11/2018   SINUSITIS - ACUTE-NOS 01/14/2010   Asymptomatic postmenopausal status 12/29/2009   Past Medical History:  Diagnosis Date   Obesity 2015   95 lb wt loss 01/2018-04/2019   History reviewed. No pertinent surgical history. Social History   Tobacco Use   Smoking status: Former    Current packs/day: 0.00    Average packs/day: 0.2 packs/day for 5.0 years (1.0 ttl pk-yrs)    Types: Cigarettes    Start  date: 09/08/1981    Quit date: 09/09/1986    Years since quitting: 36.5   Smokeless tobacco: Never  Vaping Use   Vaping status: Never Used  Substance Use Topics   Alcohol use: Yes    Alcohol/week: 5.0 standard drinks of alcohol    Types: 5 Glasses of wine per week   Drug use: No   Social History   Socioeconomic History   Marital status: Married    Spouse name: Charles   Number of children: 0   Years of education: Not on file   Highest education level: Not on file  Occupational History   Occupation: Chemical engineer    Comment: parish   Tobacco Use   Smoking status: Former    Current packs/day: 0.00    Average packs/day: 0.2 packs/day for 5.0 years (1.0 ttl pk-yrs)    Types: Cigarettes    Start date: 09/08/1981    Quit date: 09/09/1986    Years since quitting: 36.5   Smokeless tobacco: Never  Vaping Use   Vaping status: Never Used  Substance and Sexual Activity   Alcohol use: Yes    Alcohol/week: 5.0 standard drinks of alcohol    Types: 5 Glasses of wine per week   Drug use: No   Sexual activity: Yes    Partners: Male  Other Topics Concern   Not on file  Social History Narrative   Exercise--walk with dog   Social Determinants of  Health   Financial Resource Strain: Not on file  Food Insecurity: Not on file  Transportation Needs: Not on file  Physical Activity: Not on file  Stress: Not on file  Social Connections: Not on file  Intimate Partner Violence: Not on file   Family Status  Relation Name Status   Mother  Deceased at age 32       lymphoma   Father  Deceased at age 56       septicemia   Neg Hx  (Not Specified)  No partnership data on file   Family History  Problem Relation Age of Onset   Cancer Mother 72       lymphoma   Cancer Father        renal 1969, 1999   Arthritis Father    Colon cancer Neg Hx    Colon polyps Neg Hx    Esophageal cancer Neg Hx    Rectal cancer Neg Hx    Stomach cancer Neg Hx    No Known Allergies    Review  of Systems  Constitutional:  Negative for chills, fever and malaise/fatigue.  HENT:  Negative for congestion and hearing loss.   Eyes:  Negative for blurred vision and discharge.  Respiratory:  Negative for cough, sputum production and shortness of breath.   Cardiovascular:  Negative for chest pain, palpitations and leg swelling.  Gastrointestinal:  Negative for abdominal pain, blood in stool, constipation, diarrhea, heartburn, nausea and vomiting.  Genitourinary:  Negative for dysuria, frequency, hematuria and urgency.  Musculoskeletal:  Negative for back pain, falls and myalgias.  Skin:  Negative for rash.  Neurological:  Negative for dizziness, sensory change, loss of consciousness, weakness and headaches.  Endo/Heme/Allergies:  Negative for environmental allergies. Does not bruise/bleed easily.  Psychiatric/Behavioral:  Negative for depression and suicidal ideas. The patient is not nervous/anxious and does not have insomnia.       Objective:     BP 128/80 (BP Location: Left Arm, Patient Position: Sitting, Cuff Size: Normal)   Pulse 80   Temp 98.7 F (37.1 C) (Oral)   Resp 18   Ht 5\' 4"  (1.626 m)   Wt 187 lb (84.8 kg)   SpO2 97%   BMI 32.10 kg/m  BP Readings from Last 3 Encounters:  03/15/23 128/80  03/10/22 108/80  02/28/21 118/78   Wt Readings from Last 3 Encounters:  03/15/23 187 lb (84.8 kg)  03/10/22 190 lb 12.8 oz (86.5 kg)  02/28/21 169 lb 3.2 oz (76.7 kg)   SpO2 Readings from Last 3 Encounters:  03/15/23 97%  03/10/22 98%  02/28/21 98%      Physical Exam Vitals and nursing note reviewed.  Constitutional:      General: She is not in acute distress.    Appearance: Normal appearance. She is well-developed.  HENT:     Head: Normocephalic and atraumatic.     Right Ear: Tympanic membrane, ear canal and external ear normal. There is no impacted cerumen.     Left Ear: Tympanic membrane, ear canal and external ear normal. There is no impacted cerumen.      Nose: Nose normal.     Mouth/Throat:     Mouth: Mucous membranes are moist.     Pharynx: Oropharynx is clear. No oropharyngeal exudate or posterior oropharyngeal erythema.  Eyes:     General: No scleral icterus.       Right eye: No discharge.        Left eye: No discharge.  Conjunctiva/sclera: Conjunctivae normal.     Pupils: Pupils are equal, round, and reactive to light.  Neck:     Thyroid: No thyromegaly or thyroid tenderness.     Vascular: No JVD.  Cardiovascular:     Rate and Rhythm: Normal rate and regular rhythm.     Heart sounds: Normal heart sounds. No murmur heard. Pulmonary:     Effort: Pulmonary effort is normal. No respiratory distress.     Breath sounds: Normal breath sounds.  Abdominal:     General: Bowel sounds are normal. There is no distension.     Palpations: Abdomen is soft. There is no mass.     Tenderness: There is no abdominal tenderness. There is no guarding or rebound.  Genitourinary:    Vagina: Normal.  Musculoskeletal:        General: Normal range of motion.     Cervical back: Normal range of motion and neck supple.     Right lower leg: No edema.     Left lower leg: No edema.  Lymphadenopathy:     Cervical: No cervical adenopathy.  Skin:    General: Skin is warm and dry.     Findings: No erythema or rash.  Neurological:     Mental Status: She is alert and oriented to person, place, and time.     Cranial Nerves: No cranial nerve deficit.     Deep Tendon Reflexes: Reflexes are normal and symmetric.  Psychiatric:        Mood and Affect: Mood normal.        Behavior: Behavior normal.        Thought Content: Thought content normal.        Judgment: Judgment normal.      No results found for any visits on 03/15/23.  Last CBC Lab Results  Component Value Date   WBC 6.0 03/10/2022   HGB 14.2 03/10/2022   HCT 42.3 03/10/2022   MCV 92.1 03/10/2022   MCH 30.6 02/20/2020   RDW 13.8 03/10/2022   PLT 338.0 03/10/2022   Last metabolic  panel Lab Results  Component Value Date   GLUCOSE 88 03/10/2022   NA 139 03/10/2022   K 4.8 03/10/2022   CL 103 03/10/2022   CO2 29 03/10/2022   BUN 12 03/10/2022   CREATININE 0.64 03/10/2022   GFR 93.62 03/10/2022   CALCIUM 9.5 03/10/2022   PROT 6.6 03/10/2022   ALBUMIN 4.3 03/10/2022   LABGLOB 2.0 11/23/2020   AGRATIO 2.3 (H) 11/23/2020   BILITOT 0.4 03/10/2022   ALKPHOS 83 03/10/2022   AST 19 03/10/2022   ALT 16 03/10/2022   Last lipids Lab Results  Component Value Date   CHOL 181 03/10/2022   HDL 66.50 03/10/2022   LDLCALC 103 (H) 03/10/2022   TRIG 60.0 03/10/2022   CHOLHDL 3 03/10/2022   Last hemoglobin A1c Lab Results  Component Value Date   HGBA1C 5.0 11/23/2020   Last thyroid functions Lab Results  Component Value Date   TSH 2.12 03/10/2022   Last vitamin D Lab Results  Component Value Date   VD25OH 48.75 03/10/2022   Last vitamin B12 and Folate Lab Results  Component Value Date   VITAMINB12 516 01/09/2018   FOLATE 8.5 01/09/2018      The 10-year ASCVD risk score (Arnett DK, et al., 2019) is: 4.9%    Assessment & Plan:  Assessment and Plan    Weight Management Significant weight loss from 255 lbs to current 185 lbs. Patient had  a period of weight regain but has since stabilized. -Continue current lifestyle modifications.  Tinnitus Bilateral tinnitus, no change in symptoms. Patient mentioned a potential new treatment involving electrical redirection of brain cells. -Referral to [facility], Nose, and Throat for hearing test and further evaluation.  General Health Maintenance -Administer influenza vaccine today. -Administer pneumonia vaccine today due to patient being 67 years old. -Schedule mammogram for December. -Referral for bone density test at the [facility].  Follow-up in 1 year.       Problem List Items Addressed This Visit       Unprioritized   Preventative health care - Primary    Ghm utd Chec labs  See  AVS Health Maintenance  Topic Date Due   COVID-19 Vaccine (4 - 2023-24 season) 01/07/2023   HIV Screening  10/11/2027 (Originally 02/12/1971)   MAMMOGRAM  04/15/2023   Cervical Cancer Screening (HPV/Pap Cotest)  03/10/2025   Colonoscopy  11/06/2030   DTaP/Tdap/Td (4 - Td or Tdap) 03/10/2032   Pneumonia Vaccine 84+ Years old  Completed   INFLUENZA VACCINE  Completed   DEXA SCAN  Completed   Hepatitis C Screening  Completed   Zoster Vaccines- Shingrix  Completed   HPV VACCINES  Aged Out         Relevant Orders   CBC with Differential/Platelet   Comprehensive metabolic panel   Lipid panel   TSH   Vitamin B12   VITAMIN D 25 Hydroxy (Vit-D Deficiency, Fractures)   Insulin, random   Other hyperlipidemia    Encourage heart healthy diet such as MIND or DASH diet, increase exercise, avoid trans fats, simple carbohydrates and processed foods, consider a krill or fish or flaxseed oil cap daily.        Relevant Orders   Comprehensive metabolic panel   Lipid panel   Estrogen deficiency   Relevant Orders   DG Bone Density   Bilateral hearing loss    Refer to ent       Relevant Orders   Ambulatory referral to ENT   Other Visit Diagnoses     Need for influenza vaccination       Relevant Orders   Flu Vaccine Trivalent High Dose (Fluad) (Completed)   Need for pneumococcal 20-valent conjugate vaccination       Relevant Orders   Pneumococcal conjugate vaccine 20-valent (Prevnar 20) (Completed)       No follow-ups on file.    Donato Schultz, DO

## 2023-03-15 NOTE — Assessment & Plan Note (Signed)
Encourage heart healthy diet such as MIND or DASH diet, increase exercise, avoid trans fats, simple carbohydrates and processed foods, consider a krill or fish or flaxseed oil cap daily.  °

## 2023-03-15 NOTE — Assessment & Plan Note (Signed)
Refer to ent 

## 2023-03-15 NOTE — Assessment & Plan Note (Signed)
Ghm utd Chec labs  See AVS Health Maintenance  Topic Date Due   COVID-19 Vaccine (4 - 2023-24 season) 01/07/2023   HIV Screening  10/11/2027 (Originally [birth year]1973)   MAMMOGRAM  04/15/2023   Cervical Cancer Screening (HPV/Pap Cotest)  03/10/2025   Colonoscopy  11/06/2030   DTaP/Tdap/Td (4 - Td or Tdap) 03/10/2032   Pneumonia Vaccine 72+ Years old  Completed   INFLUENZA VACCINE  Completed   DEXA SCAN  Completed   Hepatitis C Screening  Completed   Zoster Vaccines- Shingrix  Completed   HPV VACCINES  Aged Out

## 2023-03-16 LAB — INSULIN, RANDOM: Insulin: 5.3 u[IU]/mL

## 2023-04-01 ENCOUNTER — Ambulatory Visit
Admission: RE | Admit: 2023-04-01 | Discharge: 2023-04-01 | Disposition: A | Discharge status: Home / Self Care | Payer: BC Managed Care – PPO | Source: Ambulatory Visit | Attending: Internal Medicine | Admitting: Internal Medicine

## 2023-04-01 VITALS — BP 109/74 | HR 94 | Temp 100.2°F | Resp 18

## 2023-04-01 DIAGNOSIS — J40 Bronchitis, not specified as acute or chronic: Secondary | ICD-10-CM

## 2023-04-01 DIAGNOSIS — J329 Chronic sinusitis, unspecified: Secondary | ICD-10-CM

## 2023-04-01 MED ORDER — AMOXICILLIN-POT CLAVULANATE 875-125 MG PO TABS: 1.0000 | ORAL_TABLET | Freq: Two times a day (BID) | ORAL | 0 refills | Status: DC

## 2023-04-01 MED ORDER — PREDNISONE 20 MG PO TABS: ORAL_TABLET | ORAL | 0 refills | Status: DC

## 2023-04-01 MED ORDER — CETIRIZINE HCL 10 MG PO TABS: 10.0000 mg | ORAL_TABLET | Freq: Every day | ORAL | 0 refills | Status: DC

## 2023-04-01 MED ORDER — PROMETHAZINE-DM 6.25-15 MG/5ML PO SYRP: 5.0000 mL | ORAL_SOLUTION | Freq: Three times a day (TID) | ORAL | 0 refills | Status: DC | PRN

## 2023-04-01 NOTE — ED Triage Notes (Signed)
Pt reports cough, chest congestion, on and off nasal congestion x 3 weeks. Alka Seltzer Plus gives some rleief.

## 2023-04-01 NOTE — ED Provider Notes (Signed)
[facility]  Note:  This document was prepared using Conservation officer, historic buildings and may include unintentional dictation errors.  MRN: [id number] DOB: 1955/07/14  Subjective:   Judith [name] is a 67 y.o. female presenting for 3-week history of acute onset persistent and worsening sinus congestion, sinus drainage, sinus pain, swelling within her sinuses, coughing and chest congestion.  No history of asthma.  Has a history of sinus infections.  No smoking of any kind including cigarettes, cigars, vaping, marijuana use.    No current facility-administered medications for this encounter.  Current Outpatient Medications:    Calcium Carb-Cholecalciferol (CALCIUM 500/D PO), Take by mouth., Disp: , Rfl:    Calcium-Magnesium-Vitamin D (CALCIUM 1200+D3 PO), Take 1 tablet by mouth daily. 1000U vit d, Disp: , Rfl:    Cholecalciferol (VITAMIN D3) 125 MCG (5000 UT) CAPS, Take 2,500 Units by mouth every other day., Disp: , Rfl:    Omega-3 Fatty Acids (FISH OIL) 1200 MG CAPS, Take 1 capsule by mouth daily., Disp: , Rfl:    vitamin C (ASCORBIC ACID) 500 MG tablet, Take 1,000 mg by mouth daily., Disp: , Rfl:    No Known Allergies  Past Medical History:  Diagnosis Date   Obesity 2015   95 lb wt loss 01/2018-04/2019     History reviewed. No pertinent surgical history.  Family History  Problem Relation Age of Onset   Cancer Mother 65       lymphoma   Cancer Father        renal 1969, 1999   Arthritis Father    Colon cancer Neg Hx    Colon polyps Neg Hx    Esophageal cancer Neg Hx    Rectal cancer Neg Hx    Stomach cancer Neg Hx     Social History   Tobacco Use   Smoking status: Former    Current packs/day: 0.00    Average packs/day: 0.2 packs/day for 5.0 years (1.0 ttl pk-yrs)    Types: Cigarettes    Start date: 09/08/1981    Quit date: 09/09/1986    Years since quitting: 36.5   Smokeless tobacco: Never  Vaping Use   Vaping status: Never Used   Substance Use Topics   Alcohol use: Yes    Alcohol/week: 5.0 standard drinks of alcohol    Types: 5 Glasses of wine per week   Drug use: No    ROS   Objective:   Vitals: BP 109/74 (BP Location: Right Arm)   Pulse 94   Temp 100.2 F (37.9 C) (Oral)   Resp 18   SpO2 98%   Physical Exam Constitutional:      General: She is not in acute distress.    Appearance: Normal appearance. She is well-developed and normal weight. She is not ill-appearing, toxic-appearing or diaphoretic.  HENT:     Head: Normocephalic and atraumatic.     Right Ear: Tympanic membrane, ear canal and external ear normal. No drainage or tenderness. No middle ear effusion. There is no impacted cerumen. Tympanic membrane is not erythematous or bulging.     Left Ear: Tympanic membrane, ear canal and external ear normal. No drainage or tenderness.  No middle ear effusion. There is no impacted cerumen. Tympanic membrane is not erythematous or bulging.     Nose: Congestion present. No rhinorrhea.     Mouth/Throat:     Mouth: Mucous membranes are moist. No oral lesions.     Pharynx: No pharyngeal swelling, oropharyngeal exudate, posterior oropharyngeal  erythema or uvula swelling.     Tonsils: No tonsillar exudate or tonsillar abscesses.     Comments: Significant postnasal drainage overlying pharynx. Eyes:     General: No scleral icterus.       Right eye: No discharge.        Left eye: No discharge.     Extraocular Movements: Extraocular movements intact.     Right eye: Normal extraocular motion.     Left eye: Normal extraocular motion.     Conjunctiva/sclera: Conjunctivae normal.  Cardiovascular:     Rate and Rhythm: Normal rate and regular rhythm.     Heart sounds: Normal heart sounds. No murmur heard.    No friction rub. No gallop.  Pulmonary:     Effort: Pulmonary effort is normal. No respiratory distress.     Breath sounds: No stridor. Rhonchi (mild over mid-lower lung fields bilaterally) present. No  wheezing or rales.  Chest:     Chest wall: No tenderness.  Musculoskeletal:     Cervical back: Normal range of motion and neck supple.  Lymphadenopathy:     Cervical: No cervical adenopathy.  Skin:    General: Skin is warm and dry.  Neurological:     General: No focal deficit present.     Mental Status: She is alert and oriented to person, place, and time.  Psychiatric:        Mood and Affect: Mood normal.        Behavior: Behavior normal.     Assessment and Plan :   PDMP not reviewed this encounter.  1. Sinobronchitis    Will defer x-ray imaging for now.  Recommended coverage of sinobronchitis with Augmentin, prednisone.  Continue with supportive care.  Counseled patient on potential for adverse effects with medications prescribed/recommended today, ER and return-to-clinic precautions discussed, patient verbalized understanding.    Wallis Bamberg, PA-C 04/01/23 1249

## 2023-04-16 DIAGNOSIS — H9313 Tinnitus, bilateral: Secondary | ICD-10-CM | POA: Diagnosis not present

## 2023-04-16 DIAGNOSIS — H903 Sensorineural hearing loss, bilateral: Secondary | ICD-10-CM | POA: Diagnosis not present

## 2023-04-17 ENCOUNTER — Other Ambulatory Visit: Payer: Self-pay | Admitting: Family Medicine

## 2023-04-17 DIAGNOSIS — Z1231 Encounter for screening mammogram for malignant neoplasm of breast: Secondary | ICD-10-CM

## 2023-05-14 ENCOUNTER — Ambulatory Visit
Admission: RE | Admit: 2023-05-14 | Discharge: 2023-05-14 | Disposition: A | Discharge status: Home / Self Care | Payer: BC Managed Care – PPO | Source: Ambulatory Visit | Attending: Family Medicine | Admitting: Family Medicine

## 2023-05-14 DIAGNOSIS — Z1231 Encounter for screening mammogram for malignant neoplasm of breast: Secondary | ICD-10-CM

## 2024-03-17 ENCOUNTER — Encounter: Payer: BC Managed Care – PPO | Admitting: Family Medicine

## 2024-03-20 ENCOUNTER — Encounter: Payer: Self-pay | Admitting: Family Medicine

## 2024-03-20 ENCOUNTER — Ambulatory Visit: Payer: BC Managed Care – PPO | Admitting: Family Medicine

## 2024-03-20 VITALS — BP 122/82 | HR 69 | Temp 97.8°F | Resp 16 | Ht 64.0 in | Wt 196.8 lb

## 2024-03-20 DIAGNOSIS — Z0000 Encounter for general adult medical examination without abnormal findings: Secondary | ICD-10-CM

## 2024-03-20 DIAGNOSIS — R252 Cramp and spasm: Secondary | ICD-10-CM | POA: Diagnosis not present

## 2024-03-20 DIAGNOSIS — Z23 Encounter for immunization: Secondary | ICD-10-CM

## 2024-03-20 DIAGNOSIS — E2839 Other primary ovarian failure: Secondary | ICD-10-CM | POA: Diagnosis not present

## 2024-03-20 DIAGNOSIS — E559 Vitamin D deficiency, unspecified: Secondary | ICD-10-CM

## 2024-03-20 DIAGNOSIS — E7849 Other hyperlipidemia: Secondary | ICD-10-CM

## 2024-03-20 LAB — CBC WITH DIFFERENTIAL/PLATELET
Basophils Absolute: 0 K/uL (ref 0.0–0.1)
Basophils Relative: 0.7 % (ref 0.0–3.0)
Eosinophils Absolute: 0.2 K/uL (ref 0.0–0.7)
Eosinophils Relative: 3.6 % (ref 0.0–5.0)
HCT: 43.8 % (ref 36.0–46.0)
Hemoglobin: 14.7 g/dL (ref 12.0–15.0)
Lymphocytes Relative: 45.4 % (ref 12.0–46.0)
Lymphs Abs: 2.4 K/uL (ref 0.7–4.0)
MCHC: 33.6 g/dL (ref 30.0–36.0)
MCV: 90.8 fl (ref 78.0–100.0)
Monocytes Absolute: 0.5 K/uL (ref 0.1–1.0)
Monocytes Relative: 9.2 % (ref 3.0–12.0)
Neutro Abs: 2.2 K/uL (ref 1.4–7.7)
Neutrophils Relative %: 41.1 % — ABNORMAL LOW (ref 43.0–77.0)
Platelets: 308 K/uL (ref 150.0–400.0)
RBC: 4.83 Mil/uL (ref 3.87–5.11)
RDW: 14 % (ref 11.5–15.5)
WBC: 5.2 K/uL (ref 4.0–10.5)

## 2024-03-20 LAB — COMPREHENSIVE METABOLIC PANEL WITH GFR
ALT: 15 U/L (ref 0–35)
AST: 17 U/L (ref 0–37)
Albumin: 4.5 g/dL (ref 3.5–5.2)
Alkaline Phosphatase: 84 U/L (ref 39–117)
BUN: 15 mg/dL (ref 6–23)
CO2: 28 meq/L (ref 19–32)
Calcium: 9.7 mg/dL (ref 8.4–10.5)
Chloride: 107 meq/L (ref 96–112)
Creatinine, Ser: 0.68 mg/dL (ref 0.40–1.20)
GFR: 90.96 mL/min (ref >60.00)
Glucose, Bld: 92 mg/dL (ref 70–99)
Potassium: 4.9 meq/L (ref 3.5–5.1)
Sodium: 143 meq/L (ref 135–145)
Total Bilirubin: 0.6 mg/dL (ref 0.2–1.2)
Total Protein: 6.7 g/dL (ref 6.0–8.3)

## 2024-03-20 LAB — LIPID PANEL
Cholesterol: 187 mg/dL (ref 0–200)
HDL: 73 mg/dL (ref >39.00)
LDL Cholesterol: 104 mg/dL — ABNORMAL HIGH (ref 0–99)
NonHDL: 114.33
Total CHOL/HDL Ratio: 3
Triglycerides: 51 mg/dL (ref 0.0–149.0)
VLDL: 10.2 mg/dL (ref 0.0–40.0)

## 2024-03-20 LAB — MAGNESIUM: Magnesium: 2.3 mg/dL (ref 1.5–2.5)

## 2024-03-20 LAB — TSH: TSH: 1.79 u[IU]/mL (ref 0.35–5.50)

## 2024-03-20 LAB — VITAMIN D 25 HYDROXY (VIT D DEFICIENCY, FRACTURES): VITD: 52.75 ng/mL (ref 30.00–100.00)

## 2024-03-20 NOTE — Progress Notes (Signed)
 Subjective:    Patient ID: Judith [name], female    DOB: [birth year], 68 y.o.   MRN: [id number]  Chief Complaint  Patient presents with   Annual Exam    Pt states fasting     HPI Patient is in today for cpe.  Discussed the use of AI scribe software for clinical note transcription with the patient, who gave verbal consent to proceed.  History of Present Illness Judith [name] is a 68 year old female who presents for an annual physical exam.  She experiences occasional leg cramps that typically resolve upon waking and stretching. No specific triggers have been identified, and they do not correlate with her diet or alcohol consumption. The cramps have not occurred in the past few months.  She has a history of dense breast syndrome, leading her to prefer mammograms at a specialized breast center due to frequent follow-up requirements after initial screenings elsewhere. A mammogram was performed six months ago, and she is due for another in January. She has not yet scheduled a bone density scan, as the facility she uses for mammograms no longer offers this service.  She regularly visits the eye doctor and dentist. She takes vitamin D  supplements and is considering getting a COVID-19 booster.  She works at an [employer]management [employer] and plans to continue working for several more years. She does not smoke and walks her dog for exercise. No recent stomach cramps or changes in her health since the last visit.    Past Medical History:  Diagnosis Date   Obesity 2015   95 lb wt loss 01/2018-04/2019    History reviewed. No pertinent surgical history.  Family History  Problem Relation Age of Onset   Cancer Mother 68       lymphoma   Cancer Father        renal 1969, 1999   Arthritis Father    Colon cancer Neg Hx    Colon polyps Neg Hx    Esophageal cancer Neg Hx    Rectal cancer Neg Hx    Stomach cancer Neg Hx     Social History   Socioeconomic History    Marital status: Married    Spouse name: Charles   Number of children: 0   Years of education: Not on file   Highest education level: Not on file  Occupational History   Occupation: chemical engineer    Comment: [employer]   Tobacco Use   Smoking status: Former    Current packs/day: 0.00    Average packs/day: 0.2 packs/day for 5.0 years (1.0 ttl pk-yrs)    Types: Cigarettes    Start date: 09/08/1981    Quit date: 09/09/1986    Years since quitting: 37.5   Smokeless tobacco: Never  Vaping Use   Vaping status: Never Used  Substance and Sexual Activity   Alcohol use: Yes    Alcohol/week: 5.0 standard drinks of alcohol    Types: 5 Glasses of wine per week   Drug use: No   Sexual activity: Yes    Partners: Male  Other Topics Concern   Not on file  Social History Narrative   Exercise--walk with dog   Social Drivers of Health   Financial Resource Strain: Not on file  Food Insecurity: Not on file  Transportation Needs: Not on file  Physical Activity: Not on file  Stress: Not on file  Social Connections: Not on file  Intimate Partner Violence: Not on file    Outpatient Medications  Prior to Visit  Medication Sig Dispense Refill   Calcium Carb-Cholecalciferol (CALCIUM 500/D PO) Take by mouth.     Calcium-Magnesium-Vitamin D  (CALCIUM 1200+D3 PO) Take 1 tablet by mouth daily. 1000U vit d     Cholecalciferol (VITAMIN D3) 125 MCG (5000 UT) CAPS Take 2,500 Units by mouth every other day.     Omega-3 Fatty Acids (FISH OIL) 1200 MG CAPS Take 1 capsule by mouth daily.     vitamin C (ASCORBIC ACID) 500 MG tablet Take 1,000 mg by mouth daily.     amoxicillin -clavulanate (AUGMENTIN ) 875-125 MG tablet Take 1 tablet by mouth 2 (two) times daily. 20 tablet 0   cetirizine  (ZYRTEC  ALLERGY) 10 MG tablet Take 1 tablet (10 mg total) by mouth daily. 30 tablet 0   predniSONE  (DELTASONE ) 20 MG tablet Take 2 tablets daily with breakfast. 10 tablet 0   promethazine -dextromethorphan  (PROMETHAZINE -DM) 6.25-15 MG/5ML syrup Take 5 mLs by mouth 3 (three) times daily as needed for cough. 200 mL 0   No facility-administered medications prior to visit.    No Known Allergies  Review of Systems  Constitutional:  Negative for fever and malaise/fatigue.  HENT:  Negative for congestion.   Eyes:  Negative for blurred vision.  Respiratory:  Negative for shortness of breath.   Cardiovascular:  Negative for chest pain, palpitations and leg swelling.  Gastrointestinal:  Negative for abdominal pain, blood in stool and nausea.  Genitourinary:  Negative for dysuria and frequency.  Musculoskeletal:  Negative for falls.  Skin:  Negative for rash.  Neurological:  Negative for dizziness, loss of consciousness and headaches.  Endo/Heme/Allergies:  Negative for environmental allergies.  Psychiatric/Behavioral:  Negative for depression. The patient is not nervous/anxious.        Objective:    Physical Exam Vitals and nursing note reviewed.  Constitutional:      General: She is not in acute distress.    Appearance: Normal appearance. She is well-developed.  HENT:     Head: Normocephalic and atraumatic.     Right Ear: Tympanic membrane, ear canal and external ear normal. There is no impacted cerumen.     Left Ear: Tympanic membrane, ear canal and external ear normal. There is no impacted cerumen.     Nose: Nose normal.     Mouth/Throat:     Mouth: Mucous membranes are moist.     Pharynx: Oropharynx is clear. No oropharyngeal exudate or posterior oropharyngeal erythema.  Eyes:     General: No scleral icterus.       Right eye: No discharge.        Left eye: No discharge.     Conjunctiva/sclera: Conjunctivae normal.     Pupils: Pupils are equal, round, and reactive to light.  Neck:     Thyroid : No thyromegaly or thyroid  tenderness.     Vascular: No JVD.  Cardiovascular:     Rate and Rhythm: Normal rate and regular rhythm.     Heart sounds: Normal heart sounds. No murmur  heard. Pulmonary:     Effort: Pulmonary effort is normal. No respiratory distress.     Breath sounds: Normal breath sounds.  Abdominal:     General: Bowel sounds are normal. There is no distension.     Palpations: Abdomen is soft. There is no mass.     Tenderness: There is no abdominal tenderness. There is no guarding or rebound.  Musculoskeletal:        General: Normal range of motion.     Cervical back:  Normal range of motion and neck supple.     Right lower leg: No edema.     Left lower leg: No edema.  Lymphadenopathy:     Cervical: No cervical adenopathy.  Skin:    General: Skin is warm and dry.     Findings: No erythema or rash.  Neurological:     Mental Status: She is alert and oriented to person, place, and time.     Cranial Nerves: No cranial nerve deficit.     Deep Tendon Reflexes: Reflexes are normal and symmetric.  Psychiatric:        Mood and Affect: Mood normal.        Behavior: Behavior normal.        Thought Content: Thought content normal.        Judgment: Judgment normal.     BP 122/82 (BP Location: Left Arm, Patient Position: Sitting, Cuff Size: Large)   Pulse 69   Temp 97.8 F (36.6 C) (Oral)   Resp 16   Ht 5' 4 (1.626 m)   Wt 196 lb 12.8 oz (89.3 kg)   SpO2 98%   BMI 33.78 kg/m  Wt Readings from Last 3 Encounters:  03/20/24 196 lb 12.8 oz (89.3 kg)  03/15/23 187 lb (84.8 kg)  03/10/22 190 lb 12.8 oz (86.5 kg)    Diabetic Foot Exam - Simple   No data filed    Lab Results  Component Value Date   WBC 8.3 03/15/2023   HGB 14.4 03/15/2023   HCT 43.9 03/15/2023   PLT 334.0 03/15/2023   GLUCOSE 96 03/15/2023   CHOL 193 03/15/2023   TRIG 75.0 03/15/2023   HDL 63.40 03/15/2023   LDLCALC 114 (H) 03/15/2023   ALT 19 03/15/2023   AST 18 03/15/2023   NA 140 03/15/2023   K 4.5 03/15/2023   CL 105 03/15/2023   CREATININE 0.67 03/15/2023   BUN 14 03/15/2023   CO2 28 03/15/2023   TSH 1.64 03/15/2023   HGBA1C 5.0 11/23/2020    Lab Results   Component Value Date   TSH 1.64 03/15/2023   Lab Results  Component Value Date   WBC 8.3 03/15/2023   HGB 14.4 03/15/2023   HCT 43.9 03/15/2023   MCV 93.3 03/15/2023   PLT 334.0 03/15/2023   Lab Results  Component Value Date   NA 140 03/15/2023   K 4.5 03/15/2023   CO2 28 03/15/2023   GLUCOSE 96 03/15/2023   BUN 14 03/15/2023   CREATININE 0.67 03/15/2023   BILITOT 0.4 03/15/2023   ALKPHOS 90 03/15/2023   AST 18 03/15/2023   ALT 19 03/15/2023   PROT 6.7 03/15/2023   ALBUMIN 4.2 03/15/2023   CALCIUM 9.5 03/15/2023   EGFR 76 11/23/2020   GFR 91.94 03/15/2023   Lab Results  Component Value Date   CHOL 193 03/15/2023   Lab Results  Component Value Date   HDL 63.40 03/15/2023   Lab Results  Component Value Date   LDLCALC 114 (H) 03/15/2023   Lab Results  Component Value Date   TRIG 75.0 03/15/2023   Lab Results  Component Value Date   CHOLHDL 3 03/15/2023   Lab Results  Component Value Date   HGBA1C 5.0 11/23/2020       Assessment & Plan:  Preventative health care Assessment & Plan: Ghm utd Check labs  See AVS  Health Maintenance  Topic Date Due   DEXA SCAN  09/10/2022   COVID-19 Vaccine (4 - 2025-26 season) 01/07/2024  Mammogram  05/13/2024   Colonoscopy  11/06/2030   DTaP/Tdap/Td (4 - Td or Tdap) 03/10/2032   Pneumococcal Vaccine: 50+ Years  Completed   Influenza Vaccine  Completed   Hepatitis C Screening  Completed   Zoster Vaccines- Shingrix  Completed   Meningococcal B Vaccine  Aged Out      Other hyperlipidemia -     CBC with Differential/Platelet -     Comprehensive metabolic panel with GFR -     Lipid panel -     TSH  Need for influenza vaccination -     Flu vaccine HIGH DOSE PF(Fluzone Trivalent)  Estrogen deficiency -     DG Bone Density; Future  Vitamin D  deficiency -     VITAMIN D  25 Hydroxy (Vit-D Deficiency, Fractures)  Leg cramp -     Magnesium  Assessment and Plan Assessment & Plan Adult Wellness Visit    During her routine adult wellness visit, there were no significant changes in family history or new surgeries. She has no smoking history and maintains regular eye and dental check-ups. Her mammogram and colonoscopy screenings are up to date. She continues with vitamin D  supplementation. A flu shot was administered today. A bone density scan is scheduled, and basic labs including cholesterol, glucose, renal, and hepatic function tests were ordered.  Immunization management   Her tetanus vaccination is up to date. She has not received recent COVID-19 vaccinations due to timing and availability issues. Continue to monitor the availability of COVID-19 vaccinations.  Nocturnal leg cramps   She experiences intermittent nocturnal leg cramps that resolve with stretching. No clear dietary or lifestyle triggers have been identified, but a possible magnesium deficiency is considered. Over-the-counter magnesium supplementation is recommended. Consider tonic water for its quinine content and Hyland's homeopathic remedy for leg cramps.    Callan Yontz R Lowne Chase, DO

## 2024-03-20 NOTE — Assessment & Plan Note (Signed)
 Ghm utd Check labs  See AVS  Health Maintenance  Topic Date Due   DEXA SCAN  09/10/2022   COVID-19 Vaccine (4 - 2025-26 season) 01/07/2024   Mammogram  05/13/2024   Colonoscopy  11/06/2030   DTaP/Tdap/Td (4 - Td or Tdap) 03/10/2032   Pneumococcal Vaccine: 50+ Years  Completed   Influenza Vaccine  Completed   Hepatitis C Screening  Completed   Zoster Vaccines- Shingrix  Completed   Meningococcal B Vaccine  Aged Out

## 2024-03-27 ENCOUNTER — Ambulatory Visit: Payer: Self-pay | Admitting: Family Medicine

## 2024-04-24 ENCOUNTER — Inpatient Hospital Stay (HOSPITAL_BASED_OUTPATIENT_CLINIC_OR_DEPARTMENT_OTHER): Admission: RE | Admit: 2024-04-24

## 2024-04-24 DIAGNOSIS — M8589 Other specified disorders of bone density and structure, multiple sites: Secondary | ICD-10-CM | POA: Diagnosis not present

## 2024-04-24 DIAGNOSIS — E2839 Other primary ovarian failure: Secondary | ICD-10-CM | POA: Insufficient documentation

## 2024-04-24 DIAGNOSIS — Z780 Asymptomatic menopausal state: Secondary | ICD-10-CM | POA: Diagnosis not present

## 2025-03-23 ENCOUNTER — Encounter: Admitting: Family Medicine
